# Patient Record
Sex: Female | Born: 1935 | Race: Black or African American | Hispanic: No | Smoking: Never smoker
Health system: Southern US, Community
[De-identification: ages and names within clinical notes are randomized; demographics above are authoritative.]

## PROBLEM LIST (undated history)

## (undated) DIAGNOSIS — E559 Vitamin D deficiency, unspecified: Secondary | ICD-10-CM

## (undated) DIAGNOSIS — Z1211 Encounter for screening for malignant neoplasm of colon: Secondary | ICD-10-CM

## (undated) DIAGNOSIS — I10 Essential (primary) hypertension: Secondary | ICD-10-CM

## (undated) DIAGNOSIS — E785 Hyperlipidemia, unspecified: Secondary | ICD-10-CM

## (undated) DIAGNOSIS — I452 Bifascicular block: Secondary | ICD-10-CM

## (undated) DIAGNOSIS — Z130 Encounter for screening for diseases of the blood and blood-forming organs and certain disorders involving the immune mechanism: Secondary | ICD-10-CM

## (undated) DIAGNOSIS — D649 Anemia, unspecified: Secondary | ICD-10-CM

## (undated) DIAGNOSIS — M1990 Unspecified osteoarthritis, unspecified site: Secondary | ICD-10-CM

## (undated) DIAGNOSIS — R229 Localized swelling, mass and lump, unspecified: Secondary | ICD-10-CM

## (undated) DIAGNOSIS — F419 Anxiety disorder, unspecified: Secondary | ICD-10-CM

## (undated) DIAGNOSIS — M1710 Unilateral primary osteoarthritis, unspecified knee: Secondary | ICD-10-CM

## (undated) DIAGNOSIS — M949 Disorder of cartilage, unspecified: Secondary | ICD-10-CM

## (undated) DIAGNOSIS — Z131 Encounter for screening for diabetes mellitus: Secondary | ICD-10-CM

## (undated) DIAGNOSIS — R918 Other nonspecific abnormal finding of lung field: Secondary | ICD-10-CM

## (undated) DIAGNOSIS — R7989 Other specified abnormal findings of blood chemistry: Secondary | ICD-10-CM

## (undated) DIAGNOSIS — Z01419 Encounter for gynecological examination (general) (routine) without abnormal findings: Secondary | ICD-10-CM

## (undated) DIAGNOSIS — IMO0002 Reserved for concepts with insufficient information to code with codable children: Secondary | ICD-10-CM

## (undated) DIAGNOSIS — M899 Disorder of bone, unspecified: Secondary | ICD-10-CM

## (undated) HISTORY — DX: Encounter for screening for diseases of the blood and blood-forming organs and certain disorders involving the immune mechanism: Z13.0

## (undated) HISTORY — DX: Localized swelling, mass and lump, unspecified: R22.9

## (undated) HISTORY — DX: Disorder of cartilage, unspecified: M94.9

## (undated) HISTORY — DX: Encounter for screening for malignant neoplasm of colon: Z12.11

## (undated) HISTORY — DX: Unilateral primary osteoarthritis, unspecified knee: M17.10

## (undated) HISTORY — DX: Bifascicular block: I45.2

## (undated) HISTORY — DX: Reserved for concepts with insufficient information to code with codable children: IMO0002

## (undated) HISTORY — DX: Encounter for screening for diabetes mellitus: Z13.1

## (undated) HISTORY — DX: Disorder of bone, unspecified: M89.9

## (undated) HISTORY — DX: Hyperlipidemia, unspecified: E78.5

## (undated) HISTORY — DX: Essential (primary) hypertension: I10

## (undated) HISTORY — DX: Vitamin D deficiency, unspecified: E55.9

## (undated) HISTORY — DX: Encounter for gynecological examination (general) (routine) without abnormal findings: Z01.419

## (undated) HISTORY — DX: Other specified abnormal findings of blood chemistry: R79.89

---
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
## 1973-06-10 HISTORY — PX: BACK SURGERY: SHX140

## 1991-06-11 HISTORY — PX: KNEE SURGERY: SHX244

## 2008-06-10 HISTORY — PX: OTHER SURGICAL HISTORY: SHX169

## 2011-07-23 ENCOUNTER — Other Ambulatory Visit: Payer: Self-pay | Admitting: Internal Medicine

## 2011-07-23 DIAGNOSIS — Z1231 Encounter for screening mammogram for malignant neoplasm of breast: Secondary | ICD-10-CM

## 2011-07-23 DIAGNOSIS — Z01419 Encounter for gynecological examination (general) (routine) without abnormal findings: Secondary | ICD-10-CM

## 2011-07-23 HISTORY — DX: Encounter for gynecological examination (general) (routine) without abnormal findings: Z01.419

## 2011-08-20 ENCOUNTER — Ambulatory Visit
Admission: RE | Admit: 2011-08-20 | Discharge: 2011-08-20 | Disposition: A | Discharge status: Home / Self Care | Payer: BC Managed Care – PPO | Source: Ambulatory Visit | Attending: Internal Medicine | Admitting: Internal Medicine

## 2011-08-20 ENCOUNTER — Other Ambulatory Visit: Payer: Self-pay | Admitting: Internal Medicine

## 2011-08-20 DIAGNOSIS — Z1231 Encounter for screening mammogram for malignant neoplasm of breast: Secondary | ICD-10-CM

## 2011-08-20 DIAGNOSIS — N63 Unspecified lump in unspecified breast: Secondary | ICD-10-CM

## 2011-08-30 ENCOUNTER — Ambulatory Visit
Admission: RE | Admit: 2011-08-30 | Discharge: 2011-08-30 | Disposition: A | Discharge status: Home / Self Care | Payer: Medicare Other | Source: Ambulatory Visit | Attending: Internal Medicine | Admitting: Internal Medicine

## 2011-08-30 DIAGNOSIS — N63 Unspecified lump in unspecified breast: Secondary | ICD-10-CM

## 2011-09-02 ENCOUNTER — Other Ambulatory Visit: Payer: Self-pay | Admitting: Internal Medicine

## 2011-09-02 DIAGNOSIS — N63 Unspecified lump in unspecified breast: Secondary | ICD-10-CM

## 2011-09-06 ENCOUNTER — Ambulatory Visit
Admission: RE | Admit: 2011-09-06 | Discharge: 2011-09-06 | Disposition: A | Discharge status: Home / Self Care | Payer: Medicare Other | Source: Ambulatory Visit | Attending: Internal Medicine | Admitting: Internal Medicine

## 2011-09-06 DIAGNOSIS — N63 Unspecified lump in unspecified breast: Secondary | ICD-10-CM

## 2011-09-06 MED ORDER — GADOBENATE DIMEGLUMINE 529 MG/ML IV SOLN
15.0000 mL | Freq: Once | INTRAVENOUS | Status: AC | PRN
  Administered 2011-09-06: 15 mL via INTRAVENOUS

## 2011-09-10 ENCOUNTER — Other Ambulatory Visit (HOSPITAL_COMMUNITY): Payer: Self-pay | Admitting: Internal Medicine

## 2011-09-10 DIAGNOSIS — R222 Localized swelling, mass and lump, trunk: Secondary | ICD-10-CM

## 2011-09-12 ENCOUNTER — Other Ambulatory Visit: Payer: Self-pay | Admitting: Internal Medicine

## 2011-09-12 ENCOUNTER — Telehealth (INDEPENDENT_AMBULATORY_CARE_PROVIDER_SITE_OTHER): Payer: Self-pay

## 2011-09-12 DIAGNOSIS — R222 Localized swelling, mass and lump, trunk: Secondary | ICD-10-CM

## 2011-09-12 NOTE — Telephone Encounter (Signed)
Tests reviewed by Dr Derrell Lolling. Per his request Kara Dies at Dr Volney Presser office notified that this pt will need to be referred to a Chest surgeon such as Dr Shari Heritage or Dr Nydia Bouton. She is advised this mass involves the lung and will not be a general surgery appt. She is advised we will cx appt here and she is to call pt to sched with chest surgeon.

## 2011-09-13 ENCOUNTER — Encounter (HOSPITAL_COMMUNITY)
Admission: RE | Admit: 2011-09-13 | Discharge: 2011-09-13 | Disposition: A | Discharge status: Home / Self Care | Payer: Medicare Other | Source: Ambulatory Visit | Attending: Internal Medicine | Admitting: Internal Medicine

## 2011-09-13 DIAGNOSIS — R222 Localized swelling, mass and lump, trunk: Secondary | ICD-10-CM | POA: Insufficient documentation

## 2011-09-13 DIAGNOSIS — M1990 Unspecified osteoarthritis, unspecified site: Secondary | ICD-10-CM | POA: Insufficient documentation

## 2011-09-13 MED ORDER — TECHNETIUM TC 99M MEDRONATE IV KIT
25.0000 | PACK | Freq: Once | INTRAVENOUS | Status: AC | PRN
  Administered 2011-09-13: 25 via INTRAVENOUS

## 2011-09-16 ENCOUNTER — Ambulatory Visit
Admission: RE | Admit: 2011-09-16 | Discharge: 2011-09-16 | Disposition: A | Discharge status: Home / Self Care | Payer: Medicare Other | Source: Ambulatory Visit | Attending: Internal Medicine | Admitting: Internal Medicine

## 2011-09-16 ENCOUNTER — Other Ambulatory Visit: Payer: Self-pay | Admitting: Internal Medicine

## 2011-09-16 DIAGNOSIS — R222 Localized swelling, mass and lump, trunk: Secondary | ICD-10-CM

## 2011-09-18 ENCOUNTER — Encounter: Payer: Self-pay | Admitting: Thoracic Surgery

## 2011-09-18 ENCOUNTER — Ambulatory Visit: Admission: RE | Admit: 2011-09-18 | Payer: Medicare Other | Source: Ambulatory Visit

## 2011-09-18 ENCOUNTER — Ambulatory Visit (INDEPENDENT_AMBULATORY_CARE_PROVIDER_SITE_OTHER): Payer: Medicare Other | Admitting: Thoracic Surgery

## 2011-09-18 VITALS — BP 186/86 | HR 64 | Resp 20 | Ht 67.0 in | Wt 174.0 lb

## 2011-09-18 DIAGNOSIS — M949 Disorder of cartilage, unspecified: Secondary | ICD-10-CM

## 2011-09-18 DIAGNOSIS — I10 Essential (primary) hypertension: Secondary | ICD-10-CM | POA: Insufficient documentation

## 2011-09-18 DIAGNOSIS — R222 Localized swelling, mass and lump, trunk: Secondary | ICD-10-CM

## 2011-09-18 DIAGNOSIS — E785 Hyperlipidemia, unspecified: Secondary | ICD-10-CM | POA: Insufficient documentation

## 2011-09-18 DIAGNOSIS — M899 Disorder of bone, unspecified: Secondary | ICD-10-CM | POA: Insufficient documentation

## 2011-09-18 NOTE — Progress Notes (Signed)
PCP is Oneal Grout, MD, MD Referring Provider is Oneal Grout, MD  Chief Complaint  Patient presents with  . Mediastinal Mass    Referral from Dr Glade Lloyd for eval on Left Chest wall mass    HPI: This 78 year old African American female was found to have a left chest wall mass. It was at the fourth intercostal space at the mid clavicular line and is nontender it is 3.4 x 2.87 cm in size and is dumbbell shaped. I explained that if this is probably a lipoma based on the CT findings. I recommend excision and she will have this done in approximately 4-6 weeks I explained to her that she would require a probable overnight stay.  Past Medical History  Diagnosis Date  . Hypertension   . Hyperlipemia   . Disorder of bone and cartilage, unspecified     Past Surgical History  Procedure Date  . Appendectomy 1953  . Back surgery 1975  . Knee surgery 1993  . Tumor in neck 2010    Dr Horton Finer Parthivel    Family History  Problem Relation Age of Onset  . Heart disease Mother   . Diabetes Sister   . Heart disease Son     Social History History  Substance Use Topics  . Smoking status: Never Smoker   . Smokeless tobacco: Never Used  . Alcohol Use: No    Current Outpatient Prescriptions  Medication Sig Dispense Refill  . aspirin 81 MG tablet Take 81 mg by mouth daily.      . hydrALAZINE (APRESOLINE) 25 MG tablet Take 25 mg by mouth 2 (two) times daily before a meal. Blood preasure      . hydrochlorothiazide (HYDRODIURIL) 25 MG tablet Take 25 mg by mouth daily. Blood preasure      . lisinopril (PRINIVIL,ZESTRIL) 20 MG tablet Take 20 mg by mouth daily. Blood preasure      . lovastatin (MEVACOR) 40 MG tablet Take 40 mg by mouth at bedtime. cholesterol      . metoprolol succinate (TOPROL-XL) 50 MG 24 hr tablet Take 50 mg by mouth daily. Blood preasure        No Known Allergies  Review of Systems  Constitutional: Negative.   HENT: Negative.   Eyes: Negative.   Respiratory:  Negative.   Cardiovascular: Negative.   Gastrointestinal: Negative.   Genitourinary: Negative.   Neurological: Negative.   Hematological: Negative.   Psychiatric/Behavioral: Negative.    there is a palpable 2-3 cm cyst on the chest wall mass that appears to be lipomatous in nature  BP 186/86  Pulse 64  Resp 20  Ht 5\' 7"  (1.702 m)  Wt 174 lb (78.926 kg)  BMI 27.25 kg/m2  SpO2 98% Physical Exam  Constitutional: She is oriented to person, place, and time. She appears well-developed and well-nourished.  HENT:  Head: Normocephalic and atraumatic.  Right Ear: External ear normal.  Left Ear: External ear normal.  Eyes: Conjunctivae and EOM are normal. Pupils are equal, round, and reactive to light.  Neck: Normal range of motion. Neck supple.  Cardiovascular: Normal rate and regular rhythm.   Pulmonary/Chest: Effort normal and breath sounds normal.  Abdominal: Soft. Bowel sounds are normal.  Musculoskeletal: Normal range of motion.  Neurological: She is alert and oriented to person, place, and time. She has normal reflexes.  Skin: Skin is warm and dry.  Psychiatric: She has a normal mood and affect. Her behavior is normal. Judgment and thought content normal.   3 cm  lipoma at the third intercostal space at the left midclavicular line   Diagnostic Tests: CT scan shows a bilobed lipoma left anterior chest wall   Impression: Lipoma left anterior chest wall   Plan: Excision of lipoma left anterior

## 2011-09-24 ENCOUNTER — Ambulatory Visit (INDEPENDENT_AMBULATORY_CARE_PROVIDER_SITE_OTHER): Payer: Medicare Other | Admitting: General Surgery

## 2011-10-09 DIAGNOSIS — R918 Other nonspecific abnormal finding of lung field: Secondary | ICD-10-CM

## 2011-10-09 HISTORY — DX: Other nonspecific abnormal finding of lung field: R91.8

## 2011-10-15 ENCOUNTER — Encounter: Payer: Self-pay | Admitting: Thoracic Surgery

## 2011-10-15 ENCOUNTER — Ambulatory Visit (INDEPENDENT_AMBULATORY_CARE_PROVIDER_SITE_OTHER): Payer: Medicare Other | Admitting: Thoracic Surgery

## 2011-10-15 ENCOUNTER — Other Ambulatory Visit: Payer: Self-pay

## 2011-10-15 VITALS — BP 188/84 | HR 61 | Resp 16 | Ht 67.0 in | Wt 174.0 lb

## 2011-10-15 DIAGNOSIS — R222 Localized swelling, mass and lump, trunk: Secondary | ICD-10-CM

## 2011-10-15 NOTE — Progress Notes (Signed)
HPI patient returns today with questions regarding her lipoma on the left anterior chest wall. She has what appears to be a lipoma between the third and fourth ribs anteriorly at the mid clavicular line. I have recommended excision. She had questions regarding the surgery. I told her that we could do this with a small incision but she may require a chest tube. If this is a liposarcoma, then I will have to do a wide excision with chest wall reconstruction. She understands the risk of the procedure and agrees to the surgery.  Current Outpatient Prescriptions  Medication Sig Dispense Refill  . aspirin 81 MG tablet Take 81 mg by mouth daily.      . hydrALAZINE (APRESOLINE) 25 MG tablet Take 25 mg by mouth 2 (two) times daily before a meal. Blood preasure      . hydrochlorothiazide (HYDRODIURIL) 25 MG tablet Take 25 mg by mouth daily. Blood preasure      . lisinopril (PRINIVIL,ZESTRIL) 20 MG tablet Take 20 mg by mouth daily. Blood preasure      . lovastatin (MEVACOR) 40 MG tablet Take 40 mg by mouth at bedtime. cholesterol      . metoprolol succinate (TOPROL-XL) 50 MG 24 hr tablet Take 50 mg by mouth daily. Blood preasure      . Vitamin D, Ergocalciferol, (DRISDOL) 50000 UNITS CAPS Take 50,000 Units by mouth.         Review of Systems: unchanged    Physical Exam lungs are clear to auscultation percussion probable lung mass left anterior chest wall    Diagnostic Tests: none   Impressi probable lipoma left anterior chest wallPlan: Excision on Tuesday, May14

## 2011-10-16 ENCOUNTER — Encounter (HOSPITAL_COMMUNITY): Payer: Self-pay | Admitting: Pharmacy Technician

## 2011-10-18 ENCOUNTER — Encounter (HOSPITAL_COMMUNITY): Payer: Self-pay

## 2011-10-18 ENCOUNTER — Encounter (HOSPITAL_COMMUNITY)
Admission: RE | Admit: 2011-10-18 | Discharge: 2011-10-18 | Disposition: A | Discharge status: Home / Self Care | Payer: Medicare Other | Source: Ambulatory Visit | Attending: Thoracic Surgery | Admitting: Thoracic Surgery

## 2011-10-18 VITALS — BP 157/74 | HR 64 | Temp 97.1°F | Resp 20 | Ht 67.0 in | Wt 170.8 lb

## 2011-10-18 DIAGNOSIS — R222 Localized swelling, mass and lump, trunk: Secondary | ICD-10-CM

## 2011-10-18 HISTORY — DX: Unspecified osteoarthritis, unspecified site: M19.90

## 2011-10-18 HISTORY — DX: Anxiety disorder, unspecified: F41.9

## 2011-10-18 HISTORY — DX: Anemia, unspecified: D64.9

## 2011-10-18 LAB — BLOOD GAS, ARTERIAL
Acid-Base Excess: 3.1 mmol/L — ABNORMAL HIGH (ref 0.0–2.0)
Bicarbonate: 27.1 meq/L — ABNORMAL HIGH (ref 20.0–24.0)
Drawn by: 181601
FIO2: 0.21 %
O2 Saturation: 98.3 %
Patient temperature: 98.6
TCO2: 28.4 mmol/L (ref 0–100)
pCO2 arterial: 41.8 mmHg (ref 35.0–45.0)
pH, Arterial: 7.428 — ABNORMAL HIGH (ref 7.350–7.400)
pO2, Arterial: 97.2 mmHg (ref 80.0–100.0)

## 2011-10-18 LAB — CBC
MCH: 23.4 pg — ABNORMAL LOW (ref 26.0–34.0)
MCHC: 31.5 g/dL (ref 30.0–36.0)
Platelets: 206 10*3/uL (ref 150–400)
RBC: 4.65 MIL/uL (ref 3.87–5.11)

## 2011-10-18 LAB — COMPREHENSIVE METABOLIC PANEL
ALT: 14 U/L (ref 0–35)
AST: 15 U/L (ref 0–37)
CO2: 24 mEq/L (ref 19–32)
Calcium: 10.2 mg/dL (ref 8.4–10.5)
Sodium: 139 mEq/L (ref 135–145)
Total Protein: 7.4 g/dL (ref 6.0–8.3)

## 2011-10-18 LAB — TYPE AND SCREEN
ABO/RH(D): B POS
Antibody Screen: NEGATIVE

## 2011-10-18 LAB — URINALYSIS, ROUTINE W REFLEX MICROSCOPIC
Bilirubin Urine: NEGATIVE
Glucose, UA: NEGATIVE mg/dL
Hgb urine dipstick: NEGATIVE
Ketones, ur: NEGATIVE mg/dL
Nitrite: NEGATIVE
Protein, ur: NEGATIVE mg/dL
Specific Gravity, Urine: 1.025 (ref 1.005–1.030)
Urobilinogen, UA: 0.2 mg/dL (ref 0.0–1.0)
pH: 6 (ref 5.0–8.0)

## 2011-10-18 LAB — SURGICAL PCR SCREEN: MRSA, PCR: NEGATIVE

## 2011-10-18 LAB — APTT: aPTT: 35 s (ref 24–37)

## 2011-10-18 LAB — PROTIME-INR
INR: 0.94 (ref 0.00–1.49)
Prothrombin Time: 12.8 s (ref 11.6–15.2)

## 2011-10-18 NOTE — Pre-Procedure Instructions (Signed)
20 Andrea Fox  10/18/2011   Your procedure is scheduled on:  10/22/11  Report to [facility] at 530 AM.  Call this number if you have problems the morning of surgery: [phone]   Remember:   Do not eat food:After Midnight.  May have clear liquids: up to 4 Hours before arrival.  Clear liquids include soda, tea, black coffee, apple or grape juice, broth.  Take these medicines the morning of surgery with A SIP OF WATER: apresoline,metoprolol   Do not wear jewelry, make-up or nail polish.  Do not wear lotions, powders, or perfumes. You may wear deodorant.  Do not shave 48 hours prior to surgery.  Do not bring valuables to the hospital.  Contacts, dentures or bridgework may not be worn into surgery.  Leave suitcase in the car. After surgery it may be brought to your room.  For patients admitted to the hospital, checkout time is 11:00 AM the day of discharge.   Patients discharged the day of surgery will not be allowed to drive home.  Name and phone number of your driver: family  Special Instructions: CHG Shower Use Special Wash: 1/2 bottle night before surgery and 1/2 bottle morning of surgery.   Please read over the following fact sheets that you were given: Pain Booklet, Coughing and Deep Breathing, Blood Transfusion Information, MRSA Information and Surgical Site Infection Prevention

## 2011-10-21 MED ORDER — DEXTROSE 5 % IV SOLN
1.5000 g | INTRAVENOUS | Status: DC
  Filled 2011-10-21: qty 1.5

## 2011-10-22 ENCOUNTER — Encounter (HOSPITAL_COMMUNITY): Payer: Self-pay | Admitting: *Deleted

## 2011-10-22 ENCOUNTER — Encounter (HOSPITAL_COMMUNITY): Payer: Self-pay | Admitting: Anesthesiology

## 2011-10-22 ENCOUNTER — Encounter (HOSPITAL_COMMUNITY)
Admission: RE | Disposition: A | Discharge status: Home / Self Care | Payer: Self-pay | Source: Ambulatory Visit | Attending: Thoracic Surgery

## 2011-10-22 ENCOUNTER — Ambulatory Visit (HOSPITAL_COMMUNITY): Payer: Medicare Other | Admitting: Anesthesiology

## 2011-10-22 ENCOUNTER — Ambulatory Visit (HOSPITAL_COMMUNITY)
Admission: RE | Admit: 2011-10-22 | Discharge: 2011-10-23 | Disposition: A | Discharge status: Home / Self Care | Payer: Medicare Other | Source: Ambulatory Visit | Attending: Thoracic Surgery | Admitting: Thoracic Surgery

## 2011-10-22 ENCOUNTER — Observation Stay (HOSPITAL_COMMUNITY): Payer: Medicare Other

## 2011-10-22 DIAGNOSIS — E785 Hyperlipidemia, unspecified: Secondary | ICD-10-CM | POA: Insufficient documentation

## 2011-10-22 DIAGNOSIS — M899 Disorder of bone, unspecified: Secondary | ICD-10-CM | POA: Insufficient documentation

## 2011-10-22 DIAGNOSIS — R222 Localized swelling, mass and lump, trunk: Secondary | ICD-10-CM

## 2011-10-22 DIAGNOSIS — F411 Generalized anxiety disorder: Secondary | ICD-10-CM | POA: Insufficient documentation

## 2011-10-22 DIAGNOSIS — D1779 Benign lipomatous neoplasm of other sites: Secondary | ICD-10-CM | POA: Insufficient documentation

## 2011-10-22 DIAGNOSIS — M949 Disorder of cartilage, unspecified: Secondary | ICD-10-CM | POA: Insufficient documentation

## 2011-10-22 DIAGNOSIS — D235 Other benign neoplasm of skin of trunk: Secondary | ICD-10-CM

## 2011-10-22 DIAGNOSIS — I10 Essential (primary) hypertension: Secondary | ICD-10-CM | POA: Insufficient documentation

## 2011-10-22 HISTORY — PX: OTHER SURGICAL HISTORY: SHX169

## 2011-10-22 HISTORY — PX: MASS EXCISION: SHX2000

## 2011-10-22 HISTORY — DX: Other nonspecific abnormal finding of lung field: R91.8

## 2011-10-22 SURGERY — EXCISION MASS
Anesthesia: General | Site: Chest | Laterality: Left | Wound class: Clean

## 2011-10-22 MED ORDER — GLYCOPYRROLATE 0.2 MG/ML IJ SOLN
INTRAMUSCULAR | Status: DC | PRN
  Administered 2011-10-22: 0.6 mg via INTRAVENOUS

## 2011-10-22 MED ORDER — ONDANSETRON HCL 4 MG/2ML IJ SOLN: 4.0000 mg | Freq: Four times a day (QID) | INTRAMUSCULAR | Status: DC | PRN

## 2011-10-22 MED ORDER — 0.9 % SODIUM CHLORIDE (POUR BTL) OPTIME
TOPICAL | Status: DC | PRN
  Administered 2011-10-22: 1000 mL

## 2011-10-22 MED ORDER — MORPHINE SULFATE 4 MG/ML IJ SOLN: 0.0500 mg/kg | INTRAMUSCULAR | Status: DC | PRN

## 2011-10-22 MED ORDER — EPHEDRINE SULFATE 50 MG/ML IJ SOLN
INTRAMUSCULAR | Status: DC | PRN
  Administered 2011-10-22: 10 mg via INTRAVENOUS

## 2011-10-22 MED ORDER — DEXTROSE 5 % IV SOLN
1.5000 g | INTRAVENOUS | Status: DC | PRN
  Administered 2011-10-22: 1.5 g via INTRAVENOUS

## 2011-10-22 MED ORDER — ONDANSETRON HCL 4 MG/2ML IJ SOLN
INTRAMUSCULAR | Status: DC | PRN
  Administered 2011-10-22: 4 mg via INTRAVENOUS

## 2011-10-22 MED ORDER — ASPIRIN 81 MG PO CHEW
81.0000 mg | CHEWABLE_TABLET | Freq: Every day | ORAL | Status: DC
  Administered 2011-10-22: 81 mg via ORAL
  Filled 2011-10-22 (×2): qty 1

## 2011-10-22 MED ORDER — HYDRALAZINE HCL 25 MG PO TABS
25.0000 mg | ORAL_TABLET | Freq: Two times a day (BID) | ORAL | Status: DC
  Administered 2011-10-23: 25 mg via ORAL
  Filled 2011-10-22 (×4): qty 1

## 2011-10-22 MED ORDER — KCL IN DEXTROSE-NACL 20-5-0.45 MEQ/L-%-% IV SOLN
INTRAVENOUS | Status: DC
  Administered 2011-10-22: 10:00:00 via INTRAVENOUS
  Filled 2011-10-22 (×2): qty 1000

## 2011-10-22 MED ORDER — ONDANSETRON HCL 4 MG/2ML IJ SOLN: 4.0000 mg | Freq: Once | INTRAMUSCULAR | Status: DC | PRN

## 2011-10-22 MED ORDER — LIDOCAINE HCL (PF) 2 % IJ SOLN
INTRAMUSCULAR | Status: DC | PRN
  Administered 2011-10-22: 100 mg

## 2011-10-22 MED ORDER — VITAMIN D (ERGOCALCIFEROL) 1.25 MG (50000 UNIT) PO CAPS: 50000.0000 [IU] | ORAL_CAPSULE | ORAL | Status: DC

## 2011-10-22 MED ORDER — METOPROLOL SUCCINATE ER 50 MG PO TB24
50.0000 mg | ORAL_TABLET | Freq: Every day | ORAL | Status: DC
  Filled 2011-10-22: qty 1

## 2011-10-22 MED ORDER — ROCURONIUM BROMIDE 100 MG/10ML IV SOLN
INTRAVENOUS | Status: DC | PRN
  Administered 2011-10-22: 50 mg via INTRAVENOUS

## 2011-10-22 MED ORDER — HYDROMORPHONE HCL PF 1 MG/ML IJ SOLN: 0.2500 mg | INTRAMUSCULAR | Status: DC | PRN

## 2011-10-22 MED ORDER — FENTANYL CITRATE 0.05 MG/ML IJ SOLN
INTRAMUSCULAR | Status: DC | PRN
  Administered 2011-10-22: 100 ug via INTRAVENOUS
  Administered 2011-10-22: 50 ug via INTRAVENOUS

## 2011-10-22 MED ORDER — HYDROCHLOROTHIAZIDE 25 MG PO TABS
25.0000 mg | ORAL_TABLET | Freq: Every day | ORAL | Status: DC
  Administered 2011-10-22: 25 mg via ORAL
  Filled 2011-10-22 (×2): qty 1

## 2011-10-22 MED ORDER — NEOSTIGMINE METHYLSULFATE 1 MG/ML IJ SOLN
INTRAMUSCULAR | Status: DC | PRN
  Administered 2011-10-22: 4 mg via INTRAVENOUS

## 2011-10-22 MED ORDER — ACETAMINOPHEN 650 MG RE SUPP: 650.0000 mg | RECTAL | Status: DC | PRN

## 2011-10-22 MED ORDER — PROPOFOL 10 MG/ML IV EMUL
INTRAVENOUS | Status: DC | PRN
  Administered 2011-10-22: 110 mg via INTRAVENOUS

## 2011-10-22 MED ORDER — SIMVASTATIN 20 MG PO TABS
20.0000 mg | ORAL_TABLET | Freq: Every day | ORAL | Status: DC
  Filled 2011-10-22 (×2): qty 1

## 2011-10-22 MED ORDER — LISINOPRIL 40 MG PO TABS
40.0000 mg | ORAL_TABLET | Freq: Every day | ORAL | Status: DC
Start: 2011-10-22 — End: 2011-10-23
  Administered 2011-10-22: 40 mg via ORAL
  Filled 2011-10-22 (×2): qty 1

## 2011-10-22 MED ORDER — FENTANYL CITRATE 0.05 MG/ML IJ SOLN
25.0000 ug | INTRAMUSCULAR | Status: DC | PRN
  Administered 2011-10-22 (×2): 25 ug via INTRAVENOUS
  Filled 2011-10-22 (×2): qty 2

## 2011-10-22 MED ORDER — LACTATED RINGERS IV SOLN
INTRAVENOUS | Status: DC | PRN
  Administered 2011-10-22: 07:00:00 via INTRAVENOUS

## 2011-10-22 MED ORDER — ASPIRIN 81 MG PO TABS: 81.0000 mg | ORAL_TABLET | Freq: Every day | ORAL | Status: DC

## 2011-10-22 MED ORDER — ACETAMINOPHEN 325 MG PO TABS
650.0000 mg | ORAL_TABLET | ORAL | Status: DC | PRN
  Administered 2011-10-23: 650 mg via ORAL
  Filled 2011-10-22: qty 2

## 2011-10-22 MED ORDER — OXYCODONE HCL 5 MG PO TABS
5.0000 mg | ORAL_TABLET | ORAL | Status: DC | PRN
  Administered 2011-10-22: 5 mg via ORAL
  Filled 2011-10-22: qty 2

## 2011-10-22 MED ORDER — MIDAZOLAM HCL 5 MG/5ML IJ SOLN
INTRAMUSCULAR | Status: DC | PRN
  Administered 2011-10-22: 2 mg via INTRAVENOUS

## 2011-10-22 SURGICAL SUPPLY — 37 items
CANISTER SUCTION 2500CC (MISCELLANEOUS) ×2 IMPLANT
CLIP TI MEDIUM 6 (CLIP) ×2 IMPLANT
CLOTH BEACON ORANGE TIMEOUT ST (SAFETY) ×2 IMPLANT
CONT SPEC 4OZ CLIKSEAL STRL BL (MISCELLANEOUS) ×2 IMPLANT
COVER SURGICAL LIGHT HANDLE (MISCELLANEOUS) ×4 IMPLANT
DERMABOND ADVANCED (GAUZE/BANDAGES/DRESSINGS) ×1
DERMABOND ADVANCED .7 DNX12 (GAUZE/BANDAGES/DRESSINGS) ×1 IMPLANT
DRAPE CHEST BREAST 15X10 FENES (DRAPES) ×2 IMPLANT
ELECT REM PT RETURN 9FT ADLT (ELECTROSURGICAL) ×2
ELECTRODE REM PT RTRN 9FT ADLT (ELECTROSURGICAL) ×1 IMPLANT
GLOVE BIO SURGEON STRL SZ 6 (GLOVE) ×4 IMPLANT
GLOVE BIOGEL PI IND STRL 6.5 (GLOVE) ×2 IMPLANT
GLOVE BIOGEL PI INDICATOR 6.5 (GLOVE) ×2
GLOVE SURG SIGNA 7.5 PF LTX (GLOVE) ×2 IMPLANT
GOWN BRE IMP PREV XXLGXLNG (GOWN DISPOSABLE) ×2 IMPLANT
GOWN PREVENTION PLUS XLARGE (GOWN DISPOSABLE) ×2 IMPLANT
GOWN STRL NON-REIN LRG LVL3 (GOWN DISPOSABLE) ×2 IMPLANT
HEMOSTAT SURGICEL 2X14 (HEMOSTASIS) IMPLANT
KIT BASIN OR (CUSTOM PROCEDURE TRAY) ×2 IMPLANT
KIT ROOM TURNOVER OR (KITS) ×2 IMPLANT
NEEDLE 22X1 1/2 (OR ONLY) (NEEDLE) IMPLANT
NS IRRIG 1000ML POUR BTL (IV SOLUTION) ×2 IMPLANT
PACK GENERAL/GYN (CUSTOM PROCEDURE TRAY) ×2 IMPLANT
PAD ARMBOARD 7.5X6 YLW CONV (MISCELLANEOUS) ×4 IMPLANT
SPONGE GAUZE 4X4 12PLY (GAUZE/BANDAGES/DRESSINGS) ×2 IMPLANT
SPONGE INTESTINAL PEANUT (DISPOSABLE) ×2 IMPLANT
SUT SILK 2 0 TIES 10X30 (SUTURE) ×2 IMPLANT
SUT VIC AB 2-0 CT1 27 (SUTURE) ×3
SUT VIC AB 2-0 CT1 TAPERPNT 27 (SUTURE) ×3 IMPLANT
SUT VIC AB 3-0 SH 27 (SUTURE) ×1
SUT VIC AB 3-0 SH 27X BRD (SUTURE) ×1 IMPLANT
SUT VICRYL 4-0 PS2 18IN ABS (SUTURE) ×2 IMPLANT
SYR CONTROL 10ML LL (SYRINGE) IMPLANT
TAPE CLOTH SURG 4X10 WHT LF (GAUZE/BANDAGES/DRESSINGS) ×2 IMPLANT
TOWEL OR 17X24 6PK STRL BLUE (TOWEL DISPOSABLE) ×2 IMPLANT
TOWEL OR 17X26 10 PK STRL BLUE (TOWEL DISPOSABLE) ×2 IMPLANT
WATER STERILE IRR 1000ML POUR (IV SOLUTION) ×2 IMPLANT

## 2011-10-22 NOTE — Preoperative (Signed)
Beta Blockers   Reason not to administer Beta Blockers:Not Applicable 

## 2011-10-22 NOTE — H&P (View-Only) (Signed)
HPI patient returns today with questions regarding her lipoma on the left anterior chest wall. She has what appears to be a lipoma between the third and fourth ribs anteriorly at the mid clavicular line. I have recommended excision. She had questions regarding the surgery. I told her that we could do this with a small incision but she may require a chest tube. If this is a liposarcoma, then I will have to do a wide excision with chest wall reconstruction. She understands the risk of the procedure and agrees to the surgery.  Current Outpatient Prescriptions  Medication Sig Dispense Refill  . aspirin 81 MG tablet Take 81 mg by mouth daily.      . hydrALAZINE (APRESOLINE) 25 MG tablet Take 25 mg by mouth 2 (two) times daily before a meal. Blood preasure      . hydrochlorothiazide (HYDRODIURIL) 25 MG tablet Take 25 mg by mouth daily. Blood preasure      . lisinopril (PRINIVIL,ZESTRIL) 20 MG tablet Take 20 mg by mouth daily. Blood preasure      . lovastatin (MEVACOR) 40 MG tablet Take 40 mg by mouth at bedtime. cholesterol      . metoprolol succinate (TOPROL-XL) 50 MG 24 hr tablet Take 50 mg by mouth daily. Blood preasure      . Vitamin D, Ergocalciferol, (DRISDOL) 50000 UNITS CAPS Take 50,000 Units by mouth.         Review of Systems: unchanged    Physical Exam lungs are clear to auscultation percussion probable lung mass left anterior chest wall    Diagnostic Tests: none   Impressi probable lipoma left anterior chest wallPlan: Excision on Tuesday, May14     

## 2011-10-22 NOTE — Op Note (Signed)
NAMELATAUSHA, FLAMM NO.:  [id number]  MEDICAL RECORD NO.:  [id number]  LOCATION:  2028                         FACILITY:  [facility]  PHYSICIAN:  Ines Bloomer, M.D. DATE OF BIRTH:  06/27/1931  DATE OF PROCEDURE: DATE OF DISCHARGE:                              OPERATIVE REPORT   PREOPERATIVE DIAGNOSIS:  Left anterior chest wall mass, probable lipoma.  POSTOPERATIVE DIAGNOSIS:  Left anterior chest wall mass, probable lipoma at the 3rd interspace.  SURGEON:  Ines Bloomer, M.D.  FIRST ASSISTANCE:  Coral Ceo, P.A.  ANESTHESIA:  General anesthesia.  After prepping and draping around the left chest, an incision was made over the mass and dissection was carried down through the subcutaneous tissues, this was about 4 cm to 5 cm incision.  The pectoralis major muscle was split and pectoralis minor muscle was also split, and this allowed [place] to get down to the second intercostal space between the 2nd and the 3rd ribs.  There was a large lipoma below the muscle layer, and this was dissected up with sharp dissection.  We then dissected out of the inner space with sharp dissection using electrocautery for bleeding. It was then removed from the interspace in total.  No residual mass was seen.  We sent for permanent section, and it appeared to be a benign lipoma.  The muscle was closed with interrupted 2-0 Vicryl subcutaneous tissue with 3-0 Vicryl, and Dermabond for the skin.  The patient was turned to recovery room in stable condition.     Ines Bloomer, M.D.     DPB/MEDQ  D:  10/22/2011  T:  10/22/2011  Job:  [id number]

## 2011-10-22 NOTE — Interval H&P Note (Signed)
History and Physical Interval Note:  10/22/2011 7:06 AM  Andrea Fox  has presented today for surgery, with the diagnosis of (L) CHEST MASS  The various methods of treatment have been discussed with the patient and family. After consideration of risks, benefits and other options for treatment, the patient has consented to  Procedure(s) (LRB): EXCISION MASS (Left) as a surgical intervention .  The patients' history has been reviewed, patient examined, no change in status, stable for surgery.  I have reviewed the patients' chart and labs.  Questions were answered to the patient's satisfaction.     Cameron Proud

## 2011-10-22 NOTE — Anesthesia Postprocedure Evaluation (Signed)
Anesthesia Post Note  Patient: Andrea Fox  Procedure(s) Performed: Procedure(s) (LRB): EXCISION MASS (Left)  Anesthesia type: general  Patient location: PACU  Post pain: Pain level controlled  Post assessment: Patient's Cardiovascular Status Stable  Last Vitals:  Filed Vitals:   10/22/11 0907  BP: 201/71  Pulse: 75  Temp:   Resp: 20    Post vital signs: Reviewed and stable  Level of consciousness: sedated  Complications: No apparent anesthesia complications

## 2011-10-22 NOTE — Anesthesia Preprocedure Evaluation (Addendum)
Anesthesia Evaluation  Patient identified by MRN, date of birth, ID band Patient awake    Reviewed: Allergy & Precautions, H&P , NPO status , Patient's Chart, lab work & pertinent test results, reviewed documented beta blocker date and time   History of Anesthesia Complications (+) AWARENESS UNDER ANESTHESIA  Airway Mallampati: II TM Distance: >3 FB Neck ROM: Full    Dental  (+) Missing, Partial Lower, Partial Upper and Dental Advisory Given   Pulmonary neg pulmonary ROS,    Pulmonary exam normal       Cardiovascular Exercise Tolerance: Good hypertension, Rhythm:Regular     Neuro/Psych Anxiety negative neurological ROS     GI/Hepatic negative GI ROS, Neg liver ROS,   Endo/Other  negative endocrine ROS  Renal/GU negative Renal ROS  negative genitourinary   Musculoskeletal negative musculoskeletal ROS (+)   Abdominal   Peds negative pediatric ROS (+)  Hematology   Anesthesia Other Findings   Reproductive/Obstetrics negative OB ROS                         Anesthesia Physical Anesthesia Plan  ASA: III  Anesthesia Plan: General   Post-op Pain Management:    Induction: Intravenous  Airway Management Planned: Oral ETT  Additional Equipment:   Intra-op Plan:   Post-operative Plan: Extubation in OR  Informed Consent:   Dental advisory given  Plan Discussed with: CRNA and Surgeon  Anesthesia Plan Comments:        Anesthesia Quick Evaluation

## 2011-10-22 NOTE — Brief Op Note (Signed)
10/22/2011  8:39 AM  PATIENT:  Andrea Fox  78 y.o. female  PRE-OPERATIVE DIAGNOSIS:  Left Chest Mass  POST-OPERATIVE DIAGNOSIS:  Left Chest Mass  PROCEDURE:  Procedure(s) (LRB): EXCISION MASS (Left)  SURGEON:  Surgeon(s) and Role:    * Ines Bloomer, MD - Primary  PHYSICIAN ASSISTANT:   ASSISTANTS: Coral Ceo PAC ANESTHESIA:   general  EBL:  Total I/O In: 700 [I.V.:700] Out: 10 [Blood:10]  BLOOD ADMINISTERED:none  DRAINS: none   LOCAL MEDICATIONS USED:  NONE  SPECIMEN:  Excision  DISPOSITION OF SPECIMEN:  PATHOLOGY  COUNTS:  YES  TOURNIQUET:  * No tourniquets in log *  DICTATION: .Other Dictation: Dictation Number [id number]  PLAN OF CARE: Admit to inpatient   PATIENT DISPOSITION:  PACU - hemodynamically stable.   Delay start of Pharmacological VTE agent (>24hrs) due to surgical blood loss or risk of bleeding: yes

## 2011-10-22 NOTE — Transfer of Care (Signed)
Immediate Anesthesia Transfer of Care Note  Patient: Andrea Fox  Procedure(s) Performed: Procedure(s) (LRB): EXCISION MASS (Left)  Patient Location: PACU  Anesthesia Type: General  Level of Consciousness: awake, alert , oriented and sedated  Airway & Oxygen Therapy: Patient Spontanous Breathing and Patient connected to nasal cannula oxygen  Post-op Assessment: Report given to PACU RN, Post -op Vital signs reviewed and stable and Patient moving all extremities  Post vital signs: Reviewed and stable  Complications: No apparent anesthesia complications

## 2011-10-22 NOTE — Anesthesia Procedure Notes (Signed)
Procedure Name: Intubation Date/Time: 10/22/2011 7:45 AM Performed by: Fransisca Kaufmann Pre-anesthesia Checklist: Patient identified, Emergency Drugs available, Suction available, Patient being monitored and Timeout performed Patient Re-evaluated:Patient Re-evaluated prior to inductionOxygen Delivery Method: Circle system utilized Preoxygenation: Pre-oxygenation with 100% oxygen Intubation Type: IV induction Ventilation: Mask ventilation without difficulty and Oral airway inserted - appropriate to patient size Laryngoscope Size: Hyacinth Meeker and 2 Grade View: Grade II Tube type: Oral Tube size: 7.0 mm Number of attempts: 1 Airway Equipment and Method: Stylet and Oral airway Placement Confirmation: ETT inserted through vocal cords under direct vision,  positive ETCO2 and breath sounds checked- equal and bilateral Secured at: 22 cm Tube secured with: Tape Dental Injury: Teeth and Oropharynx as per pre-operative assessment

## 2011-10-23 ENCOUNTER — Ambulatory Visit (HOSPITAL_COMMUNITY): Payer: Medicare Other

## 2011-10-23 ENCOUNTER — Encounter (HOSPITAL_COMMUNITY): Payer: Self-pay | Admitting: Thoracic Surgery

## 2011-10-23 ENCOUNTER — Ambulatory Visit (INDEPENDENT_AMBULATORY_CARE_PROVIDER_SITE_OTHER): Payer: Medicare Other | Admitting: General Surgery

## 2011-10-23 MED ORDER — OXYCODONE HCL 5 MG PO TABS: 5.0000 mg | ORAL_TABLET | ORAL | Status: AC | PRN

## 2011-10-23 NOTE — Progress Notes (Signed)
1120 - pt d/c home with instructions, f/u appointment, and prescription for pain med.  Pt verbalized understanding of instructions, home with family, escorted self out per request.  Ninetta Lights RN

## 2011-10-23 NOTE — Discharge Instructions (Signed)
You may shower.  You may resume your regular diet and activity.

## 2011-10-23 NOTE — Progress Notes (Signed)
                                              1 Day Post-Op Procedure(s) (LRB): EXCISION MASS (Left) Subjective: Stable. Doing well. Wound OK. Plan DC today. Follow up in office. Lab stable.  Objective: Vital signs in last 24 hours: Temp:  [96.4 F (35.8 C)-97.9 F (36.6 C)] 97.5 F (36.4 C) (05/15 0430) Pulse Rate:  [56-88] 56  (05/15 0430) Cardiac Rhythm:  [-] Normal sinus rhythm (05/14 2100) Resp:  [15-21] 18  (05/15 0430) BP: (117-213)/(54-86) 133/61 mmHg (05/15 0430) SpO2:  [95 %-100 %] 100 % (05/15 0430)  Hemodynamic parameters for last 24 hours:    Intake/Output from previous day: 05/14 0701 - 05/15 0700 In: 920 [P.O.:120; I.V.:800] Out: 10 [Blood:10] Intake/Output this shift:    General appearance: alert Heart: regular rate and rhythm, S1, S2 normal, no murmur, click, rub or gallop Lungs: clear to auscultation bilaterally  Lab Results: No results found for this basename: WBC:2,HGB:2,HCT:2,PLT:2 in the last 72 hours BMET: No results found for this basename: NA:2,K:2,CL:2,CO2:2,GLUCOSE:2,BUN:2,CREATININE:2,CALCIUM:2 in the last 72 hours  PT/INR: No results found for this basename: LABPROT,INR in the last 72 hours ABG    Component Value Date/Time   PHART 7.428* 10/18/2011 1041   HCO3 27.1* 10/18/2011 1041   TCO2 28.4 10/18/2011 1041   O2SAT 98.3 10/18/2011 1041   CBG (last 3)  No results found for this basename: GLUCAP:3 in the last 72 hours  Assessment/Plan: S/P Procedure(s) (LRB): EXCISION MASS (Left) Plan for discharge: see discharge orders   LOS: 1 day    Breena Bevacqua PATRICK 10/23/2011

## 2011-10-23 NOTE — Discharge Summary (Signed)
[address]            [place] [address]          [phone]         Discharge Summary  Name: Andrea Fox DOB: [birth year] 78 y.o. MRN: [id number]  Admission Date: 10/22/2011 Discharge Date:    Admitting Diagnosis: Left chest wall mass  Discharge Diagnosis:  Lipoma, left chest wall Hypertension  Hyperlipidemia Disorder of bone and cartilage, unspecified   Procedures: RESECTION OF LEFT CHEST WALL MASS- 10/22/2011   HPI:  The patient is a 78 y.o. female who was referred to Dr. Edwyna Shell for evaluation of a left chest wall mass.  He felt the area represented a lipoma, which would require surgical resection.  All risks, benefits and alternatives of surgery were explained in detail, and the patient agreed to proceed.   Hospital Course:  The patient was admitted to [facility] on 5/14/2013The patient was taken to the operating room and underwent the above procedure.    The patient was kept in the hospital for overnight observation.  She remained stable, and was able to be discharged home on 10/23/2011.  Final path is pending.   Recent vital signs:  Filed Vitals:   10/23/11 0430  BP: 133/61  Pulse: 56  Temp: 97.5 F (36.4 C)  Resp: 18    Recent laboratory studies:  CBC:No results found for this basename: WBC:2,HGB:2,HCT:2,PLT:2 in the last 72 hours BMET: No results found for this basename: NA:2,K:2,CL:2,CO2:2,GLUCOSE:2,BUN:2,CREATININE:2,CALCIUM:2 in the last 72 hours  PT/INR: No results found for this basename: LABPROT,INR in the last 72 hours  Discharge Medications:   Medication List  As of 10/23/2011  7:50 AM   TAKE these medications         aspirin 81 MG tablet   Take 81 mg by mouth daily.      hydrALAZINE 25 MG tablet   Commonly known as: APRESOLINE   Take 25 mg by mouth 2 (two) times daily before a meal. Blood preasure      hydrochlorothiazide 25 MG tablet   Commonly known as: HYDRODIURIL   Take 25 mg by mouth daily.  Blood preasure      lisinopril 40 MG tablet   Commonly known as: PRINIVIL,ZESTRIL   Take 40 mg by mouth daily.      lovastatin 40 MG tablet   Commonly known as: MEVACOR   Take 40 mg by mouth at bedtime. cholesterol      metoprolol succinate 50 MG 24 hr tablet   Commonly known as: TOPROL-XL   Take 50 mg by mouth daily. Blood preasure      oxyCODONE 5 MG immediate release tablet   Commonly known as: Oxy IR/ROXICODONE   Take 1-2 tablets (5-10 mg total) by mouth every 4 (four) hours as needed for pain.      Vitamin D (Ergocalciferol) 50000 UNITS Caps   Commonly known as: DRISDOL   Take 50,000 Units by mouth every 7 (seven) days.            Discharge Instructions:  May shower daily and clean incisions with soap and water.  May resume regular diet and activity as tolerated.    Follow-up Information    Follow up with Cameron Proud, MD in 1 week. (Office will call to schedule an appointment)    Contact information:   [address]  [place] [address] [phone]           Martha Ellerby H 10/23/2011, 7:50 AM

## 2011-10-25 ENCOUNTER — Other Ambulatory Visit: Payer: Self-pay | Admitting: Thoracic Surgery

## 2011-10-25 DIAGNOSIS — R222 Localized swelling, mass and lump, trunk: Secondary | ICD-10-CM

## 2011-10-30 ENCOUNTER — Encounter: Payer: Self-pay | Admitting: Thoracic Surgery

## 2011-10-30 ENCOUNTER — Ambulatory Visit
Admission: RE | Admit: 2011-10-30 | Discharge: 2011-10-30 | Disposition: A | Discharge status: Home / Self Care | Payer: Medicare Other | Source: Ambulatory Visit | Attending: Thoracic Surgery | Admitting: Thoracic Surgery

## 2011-10-30 ENCOUNTER — Ambulatory Visit (INDEPENDENT_AMBULATORY_CARE_PROVIDER_SITE_OTHER): Payer: Medicare Other | Admitting: Thoracic Surgery

## 2011-10-30 DIAGNOSIS — Z09 Encounter for follow-up examination after completed treatment for conditions other than malignant neoplasm: Secondary | ICD-10-CM

## 2011-10-30 DIAGNOSIS — D179 Benign lipomatous neoplasm, unspecified: Secondary | ICD-10-CM

## 2011-10-30 DIAGNOSIS — R222 Localized swelling, mass and lump, trunk: Secondary | ICD-10-CM

## 2011-10-30 NOTE — Progress Notes (Signed)
HPI patient returns for followup today. Her incision is well-healed she has 6.8 cm lipoma below her medical muscles that was between the third and fourth ribs she is doing well overall with just some mild tenderness. I discussed the operative findings with her and her family. We'll see her back again in 3 weeks   Current Outpatient Prescriptions  Medication Sig Dispense Refill  . aspirin 81 MG tablet Take 81 mg by mouth daily.      . hydrALAZINE (APRESOLINE) 25 MG tablet Take 25 mg by mouth 2 (two) times daily before a meal. Blood preasure      . hydrochlorothiazide (HYDRODIURIL) 25 MG tablet Take 25 mg by mouth daily. Blood preasure      . lisinopril (PRINIVIL,ZESTRIL) 40 MG tablet Take 40 mg by mouth daily.      Marland Kitchen lovastatin (MEVACOR) 40 MG tablet Take 40 mg by mouth at bedtime. cholesterol      . metoprolol succinate (TOPROL-XL) 50 MG 24 hr tablet Take 50 mg by mouth daily. Blood preasure      . oxyCODONE (OXY IR/ROXICODONE) 5 MG immediate release tablet Take 1-2 tablets (5-10 mg total) by mouth every 4 (four) hours as needed for pain.  30 tablet  0  . Vitamin D, Ergocalciferol, (DRISDOL) 50000 UNITS CAPS Take 50,000 Units by mouth every 7 (seven) days.          Review of Systems: Unchanged   Physical Exam incision well healed lungs are clear attestation percussion   Diagnostic Tests: Chest x-ray shows no active disease   Impression: Left chest wall lipoma resected   Plan: Return in 3 weeks

## 2011-11-20 ENCOUNTER — Ambulatory Visit (INDEPENDENT_AMBULATORY_CARE_PROVIDER_SITE_OTHER): Payer: Medicare Other | Admitting: Thoracic Surgery

## 2011-11-20 ENCOUNTER — Encounter: Payer: Self-pay | Admitting: Thoracic Surgery

## 2011-11-20 VITALS — BP 144/76 | HR 64 | Resp 20 | Ht 67.0 in | Wt 170.0 lb

## 2011-11-20 DIAGNOSIS — Z09 Encounter for follow-up examination after completed treatment for conditions other than malignant neoplasm: Secondary | ICD-10-CM

## 2011-11-20 DIAGNOSIS — D179 Benign lipomatous neoplasm, unspecified: Secondary | ICD-10-CM

## 2011-11-20 NOTE — Progress Notes (Signed)
HPI patient returns for followup after excision of the chest wall lipoma of the extended into the left chest. Incision is well healed there is no evidence of any recurrence. She is doing well overall. We will refer P. followup by her primary care physician   Current Outpatient Prescriptions  Medication Sig Dispense Refill  . aspirin 81 MG tablet Take 81 mg by mouth daily.      . hydrALAZINE (APRESOLINE) 25 MG tablet Take 25 mg by mouth 2 (two) times daily before a meal. Blood preasure      . hydrochlorothiazide (HYDRODIURIL) 25 MG tablet Take 25 mg by mouth daily. Blood preasure      . lisinopril (PRINIVIL,ZESTRIL) 40 MG tablet Take 40 mg by mouth daily.      Marland Kitchen lovastatin (MEVACOR) 40 MG tablet Take 40 mg by mouth at bedtime. cholesterol      . metoprolol succinate (TOPROL-XL) 50 MG 24 hr tablet Take 50 mg by mouth daily. Blood preasure      . Vitamin D, Ergocalciferol, (DRISDOL) 50000 UNITS CAPS Take 50,000 Units by mouth every 7 (seven) days.          Review of Systems: Unchanged   Physical Exam lungs are clear auscultation percussion   Diagnostic Tests: None   Chest wall lipoma status post excision   Plan: Return as needed

## 2012-07-29 ENCOUNTER — Other Ambulatory Visit: Payer: Self-pay | Admitting: Internal Medicine

## 2012-07-29 DIAGNOSIS — Z1231 Encounter for screening mammogram for malignant neoplasm of breast: Secondary | ICD-10-CM

## 2012-07-30 ENCOUNTER — Ambulatory Visit: Payer: 59

## 2012-08-31 ENCOUNTER — Ambulatory Visit
Admission: RE | Admit: 2012-08-31 | Discharge: 2012-08-31 | Disposition: A | Discharge status: Home / Self Care | Payer: Medicare Other | Source: Ambulatory Visit | Attending: Internal Medicine | Admitting: Internal Medicine

## 2012-08-31 DIAGNOSIS — Z1231 Encounter for screening mammogram for malignant neoplasm of breast: Secondary | ICD-10-CM

## 2012-09-15 ENCOUNTER — Encounter: Payer: Self-pay | Admitting: Internal Medicine

## 2012-10-21 ENCOUNTER — Ambulatory Visit: Payer: Self-pay | Admitting: Internal Medicine

## 2012-11-03 ENCOUNTER — Encounter: Payer: Self-pay | Admitting: *Deleted

## 2012-11-04 ENCOUNTER — Encounter: Payer: Self-pay | Admitting: Internal Medicine

## 2012-11-04 ENCOUNTER — Ambulatory Visit (INDEPENDENT_AMBULATORY_CARE_PROVIDER_SITE_OTHER): Payer: Medicare Other | Admitting: Internal Medicine

## 2012-11-04 VITALS — BP 144/82 | HR 61 | Temp 97.7°F | Resp 14 | Ht 67.0 in | Wt 174.2 lb

## 2012-11-04 DIAGNOSIS — I872 Venous insufficiency (chronic) (peripheral): Secondary | ICD-10-CM

## 2012-11-04 DIAGNOSIS — M949 Disorder of cartilage, unspecified: Secondary | ICD-10-CM

## 2012-11-04 DIAGNOSIS — I878 Other specified disorders of veins: Secondary | ICD-10-CM

## 2012-11-04 DIAGNOSIS — I10 Essential (primary) hypertension: Secondary | ICD-10-CM

## 2012-11-04 DIAGNOSIS — D509 Iron deficiency anemia, unspecified: Secondary | ICD-10-CM

## 2012-11-04 DIAGNOSIS — E785 Hyperlipidemia, unspecified: Secondary | ICD-10-CM

## 2012-11-04 MED ORDER — POLYETHYLENE GLYCOL 3350 17 GM/SCOOP PO POWD: 17.0000 g | Freq: Every day | ORAL | Status: DC | PRN

## 2012-11-04 MED ORDER — FERROUS SULFATE 325 (65 FE) MG PO TABS: 325.0000 mg | ORAL_TABLET | Freq: Every day | ORAL | Status: AC

## 2012-11-04 NOTE — Progress Notes (Signed)
  Subjective:    Patient ID: Andrea Fox, female    DOB: 1932/04/17, 79 y.o.   MRN: [id number]  HPI [name] is here for routine follow up.  Has swollen feet with recent travel. She has had family events one after the other and has not been following diet or exercise regimen Denies any other complaints today Compliant with her medications bp well controlled this visit Has stopped taking her iron tablet due to constipation  Review of Systems  Constitutional: Negative for fever, chills, appetite change and fatigue.  HENT: Negative for congestion and mouth sores.   Eyes: Negative for visual disturbance.  Respiratory: Negative for cough and shortness of breath.   Cardiovascular: Positive for leg swelling. Negative for chest pain and palpitations.  Gastrointestinal: Negative for nausea, vomiting, abdominal pain, constipation and blood in stool.  Genitourinary: Negative for dysuria and pelvic pain.  Musculoskeletal: Negative for arthralgias.  Skin: Negative for rash and wound.  Neurological: Negative for weakness, numbness and headaches.  Hematological: Negative for adenopathy.  Psychiatric/Behavioral: Negative for confusion, sleep disturbance and agitation.       Objective:   Physical Exam  Constitutional: She is oriented to person, place, and time. She appears well-developed and well-nourished. No distress.  HENT:  Head: Normocephalic and atraumatic.  Eyes: Conjunctivae are normal. Pupils are equal, round, and reactive to light.  Neck: Normal range of motion. Neck supple. No JVD present.  Cardiovascular: Normal rate and regular rhythm.   Pulmonary/Chest: Effort normal and breath sounds normal.  Abdominal: Soft. Bowel sounds are normal.  Musculoskeletal: Normal range of motion. She exhibits edema.  Lymphadenopathy:    She has no cervical adenopathy.  Neurological: She is alert and oriented to person, place, and time.  Skin: Skin is warm and dry. She is not diaphoretic.   Psychiatric: She has a normal mood and affect.     BP 144/82  Pulse 61  Temp(Src) 97.7 F (36.5 C) (Oral)  Resp 14  Ht 5\' 7"  (1.702 m)  Wt 174 lb 3.2 oz (79.017 kg)  BMI 27.28 kg/m2  Labs reviewed  CBC    Component Value Date/Time   WBC 8.1 10/18/2011 1040   RBC 4.65 10/18/2011 1040   HGB 10.9* 10/18/2011 1040   HCT 34.6* 10/18/2011 1040   PLT 206 10/18/2011 1040   MCV 74.4* 10/18/2011 1040   MCH 23.4* 10/18/2011 1040   MCHC 31.5 10/18/2011 1040   RDW 15.2 10/18/2011 1040   LABS REVIEWED:  07/29/12- ekg- normal sinus rhythm, LAD, left anterior fascicular block 07/27/2012  CBC: wbc 7.5, rbc 4.64, Hemoglobin 11.0 CMP: glucose 97, BUN 12, Creatinine 0.73, Sodium 145 A1C: 6.1 TIBC 281, UIBC 201, Iron Serum 80, Iron Sat 28 Lipid: Cholesterol 162, Triglycerides 77, HDL 59, LDL 88 TSH: 2.100 Vitamin D: 41.8      Assessment & Plan:   HTN- bp better controlled. Continue hydralazine, hyzaar and toprol xl current regimen for now with statin and asa  Vit d def- continue vitamin d supplement  Hyperlipidemia- stable, continue statin  Iron def anemia- will resume ferrous sulfate 325 mg daily along with miralax to help prevent consitpation  Edema- in both legs upto ankle, likel from venous pooling during recent travel, limb elevation at rest should help. No cardiac symptoms on exam. Distal pulses are good

## 2012-11-11 DIAGNOSIS — D509 Iron deficiency anemia, unspecified: Secondary | ICD-10-CM | POA: Insufficient documentation

## 2012-11-11 DIAGNOSIS — I878 Other specified disorders of veins: Secondary | ICD-10-CM | POA: Insufficient documentation

## 2012-11-11 DIAGNOSIS — I10 Essential (primary) hypertension: Secondary | ICD-10-CM | POA: Insufficient documentation

## 2013-02-22 ENCOUNTER — Other Ambulatory Visit: Payer: Medicare Other

## 2013-02-22 DIAGNOSIS — I10 Essential (primary) hypertension: Secondary | ICD-10-CM

## 2013-02-22 DIAGNOSIS — D509 Iron deficiency anemia, unspecified: Secondary | ICD-10-CM

## 2013-02-23 LAB — CBC WITH DIFFERENTIAL/PLATELET
Basophils Absolute: 0 10*3/uL (ref 0.0–0.2)
Eosinophils Absolute: 0.1 10*3/uL (ref 0.0–0.4)
HCT: 35.3 % (ref 34.0–46.6)
Immature Grans (Abs): 0 10*3/uL (ref 0.0–0.1)
Immature Granulocytes: 0 %
Lymphs: 29 %
MCH: 23.8 pg — ABNORMAL LOW (ref 26.6–33.0)
MCHC: 32.3 g/dL (ref 31.5–35.7)
MCV: 74 fL — ABNORMAL LOW (ref 79–97)
Monocytes: 7 %
Neutrophils Relative %: 63 %
RBC: 4.78 x10E6/uL (ref 3.77–5.28)
RDW: 15.2 % (ref 12.3–15.4)
WBC: 8.2 10*3/uL (ref 3.4–10.8)

## 2013-02-23 LAB — BASIC METABOLIC PANEL
BUN: 14 mg/dL (ref 8–27)
CO2: 27 mmol/L (ref 18–29)
Chloride: 102 mmol/L (ref 97–108)
GFR calc Af Amer: 94 mL/min/{1.73_m2} (ref >59)
Glucose: 100 mg/dL — ABNORMAL HIGH (ref 65–99)
Potassium: 4.3 mmol/L (ref 3.5–5.2)

## 2013-02-24 ENCOUNTER — Encounter: Payer: Self-pay | Admitting: Internal Medicine

## 2013-02-24 ENCOUNTER — Ambulatory Visit (INDEPENDENT_AMBULATORY_CARE_PROVIDER_SITE_OTHER): Payer: Medicare Other | Admitting: Internal Medicine

## 2013-02-24 VITALS — BP 148/80 | HR 66 | Temp 96.6°F | Wt 171.0 lb

## 2013-02-24 DIAGNOSIS — I10 Essential (primary) hypertension: Secondary | ICD-10-CM

## 2013-02-24 DIAGNOSIS — K5900 Constipation, unspecified: Secondary | ICD-10-CM

## 2013-02-24 DIAGNOSIS — M899 Disorder of bone, unspecified: Secondary | ICD-10-CM

## 2013-02-24 DIAGNOSIS — M1710 Unilateral primary osteoarthritis, unspecified knee: Secondary | ICD-10-CM

## 2013-02-24 DIAGNOSIS — E785 Hyperlipidemia, unspecified: Secondary | ICD-10-CM

## 2013-02-24 DIAGNOSIS — D509 Iron deficiency anemia, unspecified: Secondary | ICD-10-CM

## 2013-02-24 DIAGNOSIS — M1711 Unilateral primary osteoarthritis, right knee: Secondary | ICD-10-CM

## 2013-02-24 NOTE — Progress Notes (Signed)
Patient ID: Andrea Fox, female   DOB: Mar 19, 1932, 79 y.o.   MRN: [id number]  Chief Complaint  Patient presents with  . Medical Managment of Chronic Issues    4 month follow-up on blood pressure and anemia    No Known Allergies  HPI Andrea Fox is here for routine follow up. She is travelling to [place] next week. She has been complaint with her medications. The swelling in her feet has resolved.  She complaints of pain in her right knee especially after climbing stairs. She also complaints of knee stiffness after sitting for long time Has been taking prune juice and bowel movement has been regular now Denies any other complaints today Compliant with her medications SBP slightly elevated this visit. bp at home SBP < 140 and DBP < 80.  Did not tolerate miralax well, thus stopped it   Review of Systems  Constitutional: Negative for fever, chills, appetite change and fatigue.  HENT: Negative for congestion and mouth sores.   Eyes: Negative for visual disturbance.  Respiratory: Negative for cough and shortness of breath.   Cardiovascular: Negative for chest pain and palpitations and leg edema Gastrointestinal: Negative for nausea, vomiting, abdominal pain, constipation and blood in stool.  Genitourinary: Negative for dysuria and pelvic pain.  Musculoskeletal: positive  for arthralgias in right knee Skin: Negative for rash and wound.  Neurological: Negative for weakness, numbness and headaches.  Hematological: Negative for adenopathy.  Psychiatric/Behavioral: Negative for confusion, sleep disturbance and agitation.   Past Medical History  Diagnosis Date  . Hypertension   . Hyperlipemia   . Disorder of bone and cartilage, unspecified   . Arthritis   . Anxiety   . Anemia   . Lung mass 10/2011  . Right bundle branch block and left posterior fascicular block   . Other abnormal blood chemistry   . Unspecified vitamin D deficiency   . Osteoarthrosis, unspecified whether generalized  or localized, lower leg   . Screening for iron deficiency anemia   . Localized superficial swelling, mass, or lump   . Screening for diabetes mellitus   . Essential hypertension, benign   . Routine gynecological examination 07/23/2011  . Special screening for malignant neoplasms, colon    Past Surgical History  Procedure Laterality Date  . Appendectomy  1953  . Back surgery  1975  . Knee surgery  1993  . Tumor in neck  2010    Dr Ellwood Sayers  . Excision lung mass  10/22/2011  . Mass excision  10/22/2011    Procedure: EXCISION MASS;  Surgeon: Ines Bloomer, MD;  Location: [facility] OR;  Service: Thoracic;  Laterality: Left;  EXCISION LEFT CHEST MASS   Current Outpatient Prescriptions on File Prior to Visit  Medication Sig Dispense Refill  . aspirin 81 MG tablet Take 81 mg by mouth daily.      . Cholecalciferol (VITAMIN D-3) 5000 UNITS TABS Take 1 tablet by mouth daily.      . ferrous sulfate 325 (65 FE) MG tablet Take 1 tablet (325 mg total) by mouth daily with breakfast.  30 tablet  3  . hydrALAZINE (APRESOLINE) 50 MG tablet Take 50 mg by mouth. Take one tablet  twice a day for blood pressure      . losartan-hydrochlorothiazide (HYZAAR) 100-25 MG per tablet Take 1 tablet by mouth daily. Take one tablet once a day for blood pressure      . lovastatin (MEVACOR) 20 MG tablet Take 20 mg by mouth at bedtime.  Take one tablet once a day for cholesterol      . metoprolol succinate (TOPROL-XL) 50 MG 24 hr tablet Take 50 mg by mouth daily. Blood preasure       No current facility-administered medications on file prior to visit.     BP 148/80  Pulse 66  Temp(Src) 96.6 F (35.9 C) (Oral)  Wt 171 lb (77.565 kg)  BMI 26.78 kg/m2  SpO2 99%  Physical Exam  Constitutional: She is oriented to person, place, and time. She appears well-developed and well-nourished. No distress.  HENT:   Head: Normocephalic and atraumatic.  Eyes: Conjunctivae are normal. Pupils are equal, round, and reactive  to light.  Neck: Normal range of motion. Neck supple. No JVD present.  Cardiovascular: Normal rate and regular rhythm.   Pulmonary/Chest: Effort normal and breath sounds normal.  Abdominal: Soft. Bowel sounds are normal.  Musculoskeletal: Normal range of motion. No edema. Some crepitus in both knees Lymphadenopathy:    She has no cervical adenopathy.  Neurological: She is alert and oriented to person, place, and time.  Skin: Skin is warm and dry. She is not diaphoretic.  Psychiatric: She has a normal mood and affect.   LABS REVIEWED:   07/29/12- ekg- normal sinus rhythm, LAD, left anterior fascicular block 07/27/2012  CBC: wbc 7.5, rbc 4.64, Hemoglobin 11.0 CMP: glucose 97, BUN 12, Creatinine 0.73, Sodium 145 A1C: 6.1 TIBC 281, UIBC 201, Iron Serum 80, Iron Sat 28 Lipid: Cholesterol 162, Triglycerides 77, HDL 59, LDL 88 TSH: 2.100 Vitamin D: 41.8  CBC    Component Value Date/Time   WBC 8.2 02/22/2013 0858   WBC 8.1 10/18/2011 1040   RBC 4.78 02/22/2013 0858   RBC 4.65 10/18/2011 1040   HGB 11.4 02/22/2013 0858   HCT 35.3 02/22/2013 0858   PLT 206 10/18/2011 1040   MCV 74* 02/22/2013 0858   MCH 23.8* 02/22/2013 0858   MCH 23.4* 10/18/2011 1040   MCHC 32.3 02/22/2013 0858   MCHC 31.5 10/18/2011 1040   RDW 15.2 02/22/2013 0858   RDW 15.2 10/18/2011 1040   LYMPHSABS 2.4 02/22/2013 0858   EOSABS 0.1 02/22/2013 0858   BASOSABS 0.0 02/22/2013 0858    CMP     Component Value Date/Time   NA 144 02/22/2013 0858   NA 139 10/18/2011 1040   K 4.3 02/22/2013 0858   CL 102 02/22/2013 0858   CO2 27 02/22/2013 0858   GLUCOSE 100* 02/22/2013 0858   GLUCOSE 88 10/18/2011 1040   BUN 14 02/22/2013 0858   BUN 13 10/18/2011 1040   CREATININE 0.71 02/22/2013 0858   CALCIUM 10.5* 02/22/2013 0858   PROT 7.4 10/18/2011 1040   ALBUMIN 4.1 10/18/2011 1040   AST 15 10/18/2011 1040   ALT 14 10/18/2011 1040   ALKPHOS 72 10/18/2011 1040   BILITOT 0.4 10/18/2011 1040   GFRNONAA 82 02/22/2013 0858   GFRAA 94 02/22/2013 0858       Assessment/ plan  Osteoarthritis- right knee OA, pt not taking any pain medication. Suggested to take tylenol or ibuprofen on as needed basis for now for the pain. If no relief with this and heat pack, to notify  HTN- bp better controlled from readings at home. Continue hydralazine, hyzaar and toprol xl current regimen for now with statin and asa. Reviewed BMP.   Iron def anemia- continue ferrous sulfate 325 mg daily along with prune juice to prevent constipation  Hyperlipidemia- stable, continue statin  Vit d def- continue vitamin d supplement  Constipation- her iron supplement could be contributing som. Prune juice has been helpful continue this  Does not want influenza vaccine this visit

## 2013-07-08 ENCOUNTER — Other Ambulatory Visit: Payer: Self-pay | Admitting: *Deleted

## 2013-07-08 MED ORDER — LOVASTATIN 20 MG PO TABS: ORAL_TABLET | ORAL | Status: AC

## 2013-07-08 MED ORDER — HYDRALAZINE HCL 50 MG PO TABS: ORAL_TABLET | ORAL | Status: DC

## 2013-07-28 ENCOUNTER — Encounter: Payer: Medicare Other | Admitting: Internal Medicine

## 2013-07-28 ENCOUNTER — Encounter: Payer: Self-pay | Admitting: Internal Medicine

## 2013-07-28 ENCOUNTER — Ambulatory Visit (INDEPENDENT_AMBULATORY_CARE_PROVIDER_SITE_OTHER): Payer: Medicare Other | Admitting: Internal Medicine

## 2013-07-28 VITALS — BP 148/78 | HR 65 | Temp 97.4°F | Resp 20 | Ht 67.0 in | Wt 178.6 lb

## 2013-07-28 DIAGNOSIS — H6121 Impacted cerumen, right ear: Secondary | ICD-10-CM | POA: Insufficient documentation

## 2013-07-28 DIAGNOSIS — D509 Iron deficiency anemia, unspecified: Secondary | ICD-10-CM

## 2013-07-28 DIAGNOSIS — H269 Unspecified cataract: Secondary | ICD-10-CM | POA: Insufficient documentation

## 2013-07-28 DIAGNOSIS — I452 Bifascicular block: Secondary | ICD-10-CM

## 2013-07-28 DIAGNOSIS — H6120 Impacted cerumen, unspecified ear: Secondary | ICD-10-CM

## 2013-07-28 DIAGNOSIS — E785 Hyperlipidemia, unspecified: Secondary | ICD-10-CM

## 2013-07-28 DIAGNOSIS — I10 Essential (primary) hypertension: Secondary | ICD-10-CM

## 2013-07-28 DIAGNOSIS — M899 Disorder of bone, unspecified: Secondary | ICD-10-CM

## 2013-07-28 DIAGNOSIS — M949 Disorder of cartilage, unspecified: Secondary | ICD-10-CM

## 2013-07-28 DIAGNOSIS — Z0000 Encounter for general adult medical examination without abnormal findings: Secondary | ICD-10-CM | POA: Insufficient documentation

## 2013-07-28 MED ORDER — LOSARTAN POTASSIUM-HCTZ 100-25 MG PO TABS: 1.0000 | ORAL_TABLET | Freq: Every day | ORAL | Status: AC

## 2013-07-28 MED ORDER — HYDRALAZINE HCL 50 MG PO TABS: ORAL_TABLET | ORAL | Status: DC

## 2013-07-28 MED ORDER — LOSARTAN POTASSIUM-HCTZ 100-25 MG PO TABS: 1.0000 | ORAL_TABLET | Freq: Every day | ORAL | Status: DC

## 2013-07-28 NOTE — Progress Notes (Signed)
Patient ID: Andrea Fox, female   DOB: December 01, 1931, 80 y.o.   MRN: [id number]     Chief Complaint  Patient presents with  . Annual Exam   No Known Allergies  HPI 80 y/o female pt here for her annual exam Does not want pelvic and breast exam. Agrees to get her mammogram.  Her son is going through heart surgery and this is stressing her out Does not want influenza and shingles vaccine Saw her eye doctor recently  Review of Systems  Constitutional: Negative for fever, chills, weight loss, malaise/fatigue and diaphoresis.  HENT: Negative for congestion, hearing loss and sore throat.   Eyes: Negative for blurred vision, double vision and discharge. wears glasses. Has cataract Respiratory: Negative for cough, sputum production, shortness of breath and wheezing.   Cardiovascular: Negative for chest pain, palpitations, orthopnea and leg swelling.  Gastrointestinal: Negative for heartburn, nausea, vomiting, abdominal pain, diarrhea and constipation.  Genitourinary: Negative for dysuria, urgency, frequency and flank pain.  Musculoskeletal: Negative for back pain, falls, myalgias. ocassional joint aches Skin: Negative for itching and rash.  Neurological: Negative for dizziness, tingling, focal weakness and headaches.  Psychiatric/Behavioral: Negative for depression and memory loss. The patient is not nervous/anxious.    Past Medical History  Diagnosis Date  . Hypertension   . Hyperlipemia   . Disorder of bone and cartilage, unspecified   . Arthritis   . Anxiety   . Anemia   . Lung mass 10/2011  . Right bundle branch block and left posterior fascicular block   . Other abnormal blood chemistry   . Unspecified vitamin D deficiency   . Osteoarthrosis, unspecified whether generalized or localized, lower leg   . Screening for iron deficiency anemia   . Localized superficial swelling, mass, or lump   . Screening for diabetes mellitus   . Essential hypertension, benign   . Routine  gynecological examination 07/23/2011  . Special screening for malignant neoplasms, colon    Past Surgical History  Procedure Laterality Date  . Appendectomy  1953  . Back surgery  1975  . Knee surgery  1993  . Tumor in neck  2010    Dr Stephani Police  . Excision lung mass  10/22/2011  . Mass excision  10/22/2011    Procedure: EXCISION MASS;  Surgeon: Nicanor Alcon, MD;  Location: [facility];  Service: Thoracic;  Laterality: Left;  EXCISION LEFT CHEST MASS   Current Outpatient Prescriptions on File Prior to Visit  Medication Sig Dispense Refill  . aspirin 81 MG tablet Take 81 mg by mouth daily.      . Cholecalciferol (VITAMIN D-3) 5000 UNITS TABS Take 1 tablet by mouth daily.      . ferrous sulfate 325 (65 FE) MG tablet Take 1 tablet (325 mg total) by mouth daily with breakfast.  30 tablet  3  . hydrALAZINE (APRESOLINE) 50 MG tablet Take one tablet  twice a day for blood pressure  180 tablet  3  . losartan-hydrochlorothiazide (HYZAAR) 100-25 MG per tablet Take 1 tablet by mouth daily. Take one tablet once a day for blood pressure      . lovastatin (MEVACOR) 20 MG tablet Take one tablet once a day for cholesterol  90 tablet  3  . metoprolol succinate (TOPROL-XL) 50 MG 24 hr tablet Take 50 mg by mouth daily. Blood preasure       No current facility-administered medications on file prior to visit.   Family History  Problem Relation Age of Onset  .  Heart disease Mother   . Diabetes Sister   . Heart disease Son    History   Social History  . Marital Status: Widowed    Spouse Name: Jolin Kain    Number of Children: N/A  . Years of Education: N/A   Occupational History  . Social worker    Social History Main Topics  . Smoking status: Never Smoker   . Smokeless tobacco: Never Used  . Alcohol Use: No  . Drug Use: No  . Sexual Activity: No   Other Topics Concern  . Not on file   Social History Narrative   Lives in apt[address]Condo. Pt lives alone   Physical exam BP  148/78  Pulse 65  Temp(Src) 97.4 F (36.3 C) (Oral)  Resp 20  Ht 5\' 7"  (1.702 m)  Wt 178 lb 9.6 oz (81.012 kg)  BMI 27.97 kg/m2  SpO2 99%  General- elderly female in no acute distress Head- atraumatic, normocephalic Eyes- PERRLA, EOMI, no pallor, no icterus, no discharge Neck- no lymphadenopathy, no thyromegaly, no jugular vein distension, no carotid bruit Ears- left ear normal tympanic membrane and normal external ear canal , right ear impacted cerumen Chest- no chest wall deformities, no chest wall tenderness Breast- refused Cardiovascular- normal s1,s2, no murmurs/ rubs/ gallops Respiratory- bilateral clear to auscultation, no wheeze, no rhonchi, no crackles Abdomen- bowel sounds present, soft, non tender, no organomegaly, no abdominal bruits, no guarding or rigidity, no CVA tenderness Pelvic exam- refused Musculoskeletal- able to move all 4 extremities, no spinal and paraspinal tenderness, steady gait, no use of assistive device Neurological- no focal deficit, normal reflexes, normal muscle strength, normal sensation to fine touch and vibration Psychiatry- alert and oriented to person, place and time, normal mood and affect  Labs- CBC    Component Value Date/Time   WBC 8.2 02/22/2013 0858   WBC 8.1 10/18/2011 1040   RBC 4.78 02/22/2013 0858   RBC 4.65 10/18/2011 1040   HGB 11.4 02/22/2013 0858   HCT 35.3 02/22/2013 0858   PLT 206 10/18/2011 1040   MCV 74* 02/22/2013 0858   MCH 23.8* 02/22/2013 0858   MCH 23.4* 10/18/2011 1040   MCHC 32.3 02/22/2013 0858   MCHC 31.5 10/18/2011 1040   RDW 15.2 02/22/2013 0858   RDW 15.2 10/18/2011 1040   LYMPHSABS 2.4 02/22/2013 0858   EOSABS 0.1 02/22/2013 0858   BASOSABS 0.0 02/22/2013 0858    CMP     Component Value Date/Time   NA 144 02/22/2013 0858   NA 139 10/18/2011 1040   K 4.3 02/22/2013 0858   CL 102 02/22/2013 0858   CO2 27 02/22/2013 0858   GLUCOSE 100* 02/22/2013 0858   GLUCOSE 88 10/18/2011 1040   BUN 14 02/22/2013 0858   BUN 13  10/18/2011 1040   CREATININE 0.71 02/22/2013 0858   CALCIUM 10.5* 02/22/2013 0858   PROT 7.4 10/18/2011 1040   ALBUMIN 4.1 10/18/2011 1040   AST 15 10/18/2011 1040   ALT 14 10/18/2011 1040   ALKPHOS 72 10/18/2011 1040   BILITOT 0.4 10/18/2011 1040   GFRNONAA 82 02/22/2013 0858   GFRAA 94 02/22/2013 0858    Assessment/plan  1. Hypertension Improved bp reading at home will decrease hydralazine to 25 mg in pm and continue 50 mg in am. Continue hyzaar and toprol xl current dosing. Check cmp  2. Disorder of bone and cartilage, unspecified Continue vitamin d supplement  3. Hyperlipemia Check lipid panel. Continue lovastatin  4. Anemia, iron deficiency Continue ferrous  sulfate and check cbc  5. Routine general medical examination at a health care facility Has upcoming mammogram. Due for repeat colonoscopy in 2017. Follows with Gyn. Will schedule dexa scan and routine labs. Dietary and exercise counselling  6. Cataract Will need to follow with her ophthalmology  7. Impacted cerumen of right ear Debrox ear drop bid x 5 days  8. Bifascicular bundle branch block New bifascicular block with RBBB and RVH noted on ekg which is new from 2014 EKG. Will get echocardiogram to assess valvular function and pulmonary hypertension  Labs- ordered with echocardiogram  Blanchie Serve, MD  [facility] [phone] (Monday-Friday 8 am - 5 pm) [phone] (afterhours)

## 2013-07-29 LAB — COMPREHENSIVE METABOLIC PANEL
A/G RATIO: 1.8 (ref 1.1–2.5)
ALBUMIN: 4.8 g/dL (ref 3.5–4.8)
ALK PHOS: 84 IU/L (ref 39–117)
ALT: 18 IU/L (ref 0–32)
AST: 15 IU/L (ref 0–40)
BILIRUBIN TOTAL: 0.3 mg/dL (ref 0.0–1.2)
BUN / CREAT RATIO: 19 (ref 11–26)
BUN: 13 mg/dL (ref 8–27)
CHLORIDE: 101 mmol/L (ref 97–108)
CO2: 27 mmol/L (ref 18–29)
CREATININE: 0.69 mg/dL (ref 0.57–1.00)
Calcium: 10.6 mg/dL — ABNORMAL HIGH (ref 8.7–10.3)
GFR calc non Af Amer: 83 mL/min/{1.73_m2} (ref >59)
GFR, EST AFRICAN AMERICAN: 96 mL/min/{1.73_m2} (ref >59)
GLOBULIN, TOTAL: 2.6 g/dL (ref 1.5–4.5)
Glucose: 91 mg/dL (ref 65–99)
Potassium: 4.2 mmol/L (ref 3.5–5.2)
Sodium: 143 mmol/L (ref 134–144)
Total Protein: 7.4 g/dL (ref 6.0–8.5)

## 2013-07-29 LAB — CBC WITH DIFFERENTIAL/PLATELET
BASOS ABS: 0 10*3/uL (ref 0.0–0.2)
Basos: 0 %
Eos: 1 %
Eosinophils Absolute: 0 10*3/uL (ref 0.0–0.4)
HEMATOCRIT: 35.3 % (ref 34.0–46.6)
HEMOGLOBIN: 11.1 g/dL (ref 11.1–15.9)
Immature Grans (Abs): 0 10*3/uL (ref 0.0–0.1)
Immature Granulocytes: 0 %
LYMPHS ABS: 2.3 10*3/uL (ref 0.7–3.1)
Lymphs: 28 %
MCH: 23.7 pg — AB (ref 26.6–33.0)
MCHC: 31.4 g/dL — AB (ref 31.5–35.7)
MCV: 75 fL — ABNORMAL LOW (ref 79–97)
MONOCYTES: 6 %
Monocytes Absolute: 0.5 10*3/uL (ref 0.1–0.9)
NEUTROS ABS: 5.4 10*3/uL (ref 1.4–7.0)
Neutrophils Relative %: 65 %
RBC: 4.69 x10E6/uL (ref 3.77–5.28)
RDW: 15.9 % — ABNORMAL HIGH (ref 12.3–15.4)
WBC: 8.3 10*3/uL (ref 3.4–10.8)

## 2013-07-29 LAB — LIPID PANEL
CHOL/HDL RATIO: 3.1 ratio (ref 0.0–4.4)
Cholesterol, Total: 170 mg/dL (ref 100–199)
HDL: 55 mg/dL (ref >39)
LDL CALC: 97 mg/dL (ref 0–99)
Triglycerides: 91 mg/dL (ref 0–149)
VLDL Cholesterol Cal: 18 mg/dL (ref 5–40)

## 2013-08-02 ENCOUNTER — Telehealth: Payer: Self-pay | Admitting: *Deleted

## 2013-08-02 NOTE — Telephone Encounter (Signed)
PT/INR results were 28.7/2.4 Please advise. thanks

## 2013-08-03 NOTE — Telephone Encounter (Signed)
Is this the right patient?  I don't see where she is on any coumadin in her med list?  Was the PT/INR done for another reason?  It looks like Dr. Bubba Camp is her PCP.

## 2013-08-04 NOTE — Telephone Encounter (Signed)
Sorry this is the wrong patient.

## 2013-08-10 ENCOUNTER — Ambulatory Visit (HOSPITAL_COMMUNITY)
Admission: RE | Admit: 2013-08-10 | Discharge: 2013-08-10 | Disposition: A | Discharge status: Home / Self Care | Payer: Medicare Other | Source: Ambulatory Visit | Attending: Internal Medicine | Admitting: Internal Medicine

## 2013-08-10 DIAGNOSIS — I452 Bifascicular block: Secondary | ICD-10-CM

## 2013-08-10 DIAGNOSIS — I10 Essential (primary) hypertension: Secondary | ICD-10-CM | POA: Insufficient documentation

## 2013-08-10 DIAGNOSIS — I059 Rheumatic mitral valve disease, unspecified: Secondary | ICD-10-CM | POA: Insufficient documentation

## 2013-08-10 DIAGNOSIS — R9431 Abnormal electrocardiogram [ECG] [EKG]: Secondary | ICD-10-CM | POA: Insufficient documentation

## 2013-08-10 DIAGNOSIS — E785 Hyperlipidemia, unspecified: Secondary | ICD-10-CM | POA: Insufficient documentation

## 2013-08-10 NOTE — Progress Notes (Signed)
Echo Lab  2D Echocardiogram completed.  [place], RDCS 08/10/2013 1:13 PM

## 2013-08-12 ENCOUNTER — Other Ambulatory Visit: Payer: Self-pay

## 2013-08-12 ENCOUNTER — Ambulatory Visit
Admission: RE | Admit: 2013-08-12 | Discharge: 2013-08-12 | Disposition: A | Discharge status: Home / Self Care | Payer: 59 | Source: Ambulatory Visit | Attending: Internal Medicine | Admitting: Internal Medicine

## 2013-08-12 DIAGNOSIS — M899 Disorder of bone, unspecified: Secondary | ICD-10-CM

## 2013-08-12 DIAGNOSIS — M949 Disorder of cartilage, unspecified: Principal | ICD-10-CM

## 2013-08-12 DIAGNOSIS — Z1231 Encounter for screening mammogram for malignant neoplasm of breast: Secondary | ICD-10-CM

## 2013-08-13 ENCOUNTER — Encounter: Payer: Self-pay | Admitting: Internal Medicine

## 2013-08-16 ENCOUNTER — Encounter: Payer: Self-pay | Admitting: *Deleted

## 2013-08-31 ENCOUNTER — Encounter: Payer: Self-pay | Admitting: Internal Medicine

## 2013-09-01 ENCOUNTER — Ambulatory Visit
Admission: RE | Admit: 2013-09-01 | Discharge: 2013-09-01 | Disposition: A | Discharge status: Home / Self Care | Payer: 59 | Source: Ambulatory Visit

## 2013-09-01 DIAGNOSIS — Z1231 Encounter for screening mammogram for malignant neoplasm of breast: Secondary | ICD-10-CM

## 2013-09-16 ENCOUNTER — Encounter: Payer: Self-pay | Admitting: Internal Medicine

## 2013-09-18 ENCOUNTER — Emergency Department (HOSPITAL_COMMUNITY): Payer: Medicare Other

## 2013-09-18 ENCOUNTER — Encounter (HOSPITAL_COMMUNITY): Payer: Self-pay | Admitting: Emergency Medicine

## 2013-09-18 ENCOUNTER — Inpatient Hospital Stay (HOSPITAL_COMMUNITY): Payer: Medicare Other

## 2013-09-18 ENCOUNTER — Inpatient Hospital Stay (HOSPITAL_COMMUNITY)
Admission: EM | Admit: 2013-09-18 | Discharge: 2013-10-08 | DRG: 085 | Disposition: E | Payer: Medicare Other | Attending: Pulmonary Disease | Admitting: Pulmonary Disease

## 2013-09-18 DIAGNOSIS — E7800 Pure hypercholesterolemia, unspecified: Secondary | ICD-10-CM | POA: Diagnosis present

## 2013-09-18 DIAGNOSIS — I469 Cardiac arrest, cause unspecified: Secondary | ICD-10-CM | POA: Diagnosis not present

## 2013-09-18 DIAGNOSIS — J9600 Acute respiratory failure, unspecified whether with hypoxia or hypercapnia: Secondary | ICD-10-CM

## 2013-09-18 DIAGNOSIS — D72829 Elevated white blood cell count, unspecified: Secondary | ICD-10-CM | POA: Diagnosis present

## 2013-09-18 DIAGNOSIS — I6350 Cerebral infarction due to unspecified occlusion or stenosis of unspecified cerebral artery: Secondary | ICD-10-CM | POA: Diagnosis present

## 2013-09-18 DIAGNOSIS — G936 Cerebral edema: Secondary | ICD-10-CM | POA: Diagnosis present

## 2013-09-18 DIAGNOSIS — E870 Hyperosmolality and hypernatremia: Secondary | ICD-10-CM | POA: Diagnosis present

## 2013-09-18 DIAGNOSIS — I10 Essential (primary) hypertension: Secondary | ICD-10-CM | POA: Diagnosis present

## 2013-09-18 DIAGNOSIS — Z7982 Long term (current) use of aspirin: Secondary | ICD-10-CM

## 2013-09-18 DIAGNOSIS — I959 Hypotension, unspecified: Secondary | ICD-10-CM | POA: Diagnosis present

## 2013-09-18 DIAGNOSIS — R4020 Unspecified coma: Secondary | ICD-10-CM | POA: Diagnosis present

## 2013-09-18 DIAGNOSIS — E785 Hyperlipidemia, unspecified: Secondary | ICD-10-CM | POA: Diagnosis present

## 2013-09-18 DIAGNOSIS — M1710 Unilateral primary osteoarthritis, unspecified knee: Secondary | ICD-10-CM | POA: Diagnosis present

## 2013-09-18 DIAGNOSIS — Y92009 Unspecified place in unspecified non-institutional (private) residence as the place of occurrence of the external cause: Secondary | ICD-10-CM

## 2013-09-18 DIAGNOSIS — M899 Disorder of bone, unspecified: Secondary | ICD-10-CM | POA: Diagnosis present

## 2013-09-18 DIAGNOSIS — E876 Hypokalemia: Secondary | ICD-10-CM | POA: Diagnosis present

## 2013-09-18 DIAGNOSIS — G935 Compression of brain: Secondary | ICD-10-CM | POA: Diagnosis present

## 2013-09-18 DIAGNOSIS — S065X9A Traumatic subdural hemorrhage with loss of consciousness of unspecified duration, initial encounter: Principal | ICD-10-CM | POA: Diagnosis present

## 2013-09-18 DIAGNOSIS — I452 Bifascicular block: Secondary | ICD-10-CM | POA: Diagnosis present

## 2013-09-18 DIAGNOSIS — G9382 Brain death: Secondary | ICD-10-CM | POA: Diagnosis not present

## 2013-09-18 DIAGNOSIS — W19XXXA Unspecified fall, initial encounter: Secondary | ICD-10-CM | POA: Diagnosis present

## 2013-09-18 DIAGNOSIS — S065XAA Traumatic subdural hemorrhage with loss of consciousness status unknown, initial encounter: Principal | ICD-10-CM | POA: Diagnosis present

## 2013-09-18 DIAGNOSIS — R7309 Other abnormal glucose: Secondary | ICD-10-CM | POA: Diagnosis present

## 2013-09-18 DIAGNOSIS — I619 Nontraumatic intracerebral hemorrhage, unspecified: Secondary | ICD-10-CM | POA: Diagnosis present

## 2013-09-18 DIAGNOSIS — M949 Disorder of cartilage, unspecified: Secondary | ICD-10-CM

## 2013-09-18 LAB — I-STAT ARTERIAL BLOOD GAS, ED
ACID-BASE EXCESS: 2 mmol/L (ref 0.0–2.0)
Bicarbonate: 26.2 mEq/L — ABNORMAL HIGH (ref 20.0–24.0)
O2 Saturation: 100 %
PO2 ART: 178 mmHg — AB (ref 80.0–100.0)
Patient temperature: 93.9
TCO2: 27 mmol/L (ref 0–100)
pCO2 arterial: 36 mmHg (ref 35.0–45.0)
pH, Arterial: 7.46 — ABNORMAL HIGH (ref 7.350–7.450)

## 2013-09-18 LAB — URINALYSIS, ROUTINE W REFLEX MICROSCOPIC
Bilirubin Urine: NEGATIVE
GLUCOSE, UA: NEGATIVE mg/dL
Hgb urine dipstick: NEGATIVE
KETONES UR: NEGATIVE mg/dL
Leukocytes, UA: NEGATIVE
NITRITE: NEGATIVE
PH: 5.5 (ref 5.0–8.0)
Protein, ur: >300 mg/dL — AB
SPECIFIC GRAVITY, URINE: 1.03 (ref 1.005–1.030)
Urobilinogen, UA: 0.2 mg/dL (ref 0.0–1.0)

## 2013-09-18 LAB — COMPREHENSIVE METABOLIC PANEL
ALBUMIN: 4 g/dL (ref 3.5–5.2)
ALT: 14 U/L (ref 0–35)
AST: 18 U/L (ref 0–37)
Alkaline Phosphatase: 73 U/L (ref 39–117)
BUN: 13 mg/dL (ref 6–23)
CHLORIDE: 103 meq/L (ref 96–112)
CO2: 24 meq/L (ref 19–32)
CREATININE: 0.71 mg/dL (ref 0.50–1.10)
Calcium: 9.9 mg/dL (ref 8.4–10.5)
GFR calc Af Amer: >90 mL/min (ref >90)
GFR, EST NON AFRICAN AMERICAN: 80 mL/min — AB (ref >90)
Glucose, Bld: 184 mg/dL — ABNORMAL HIGH (ref 70–99)
Potassium: 3.2 mEq/L — ABNORMAL LOW (ref 3.7–5.3)
SODIUM: 144 meq/L (ref 137–147)
Total Bilirubin: 0.5 mg/dL (ref 0.3–1.2)
Total Protein: 7.3 g/dL (ref 6.0–8.3)

## 2013-09-18 LAB — CBC WITH DIFFERENTIAL/PLATELET
Basophils Absolute: 0 10*3/uL (ref 0.0–0.1)
Basophils Relative: 0 % (ref 0–1)
Eosinophils Absolute: 0.1 10*3/uL (ref 0.0–0.7)
Eosinophils Relative: 0 % (ref 0–5)
HEMATOCRIT: 34.2 % — AB (ref 36.0–46.0)
HEMOGLOBIN: 10.8 g/dL — AB (ref 12.0–15.0)
LYMPHS ABS: 4 10*3/uL (ref 0.7–4.0)
Lymphocytes Relative: 28 % (ref 12–46)
MCH: 23.9 pg — ABNORMAL LOW (ref 26.0–34.0)
MCHC: 31.6 g/dL (ref 30.0–36.0)
MCV: 75.8 fL — ABNORMAL LOW (ref 78.0–100.0)
MONO ABS: 0.5 10*3/uL (ref 0.1–1.0)
MONOS PCT: 4 % (ref 3–12)
NEUTROS ABS: 9.6 10*3/uL — AB (ref 1.7–7.7)
NEUTROS PCT: 68 % (ref 43–77)
Platelets: 218 10*3/uL (ref 150–400)
RBC: 4.51 MIL/uL (ref 3.87–5.11)
RDW: 15.6 % — ABNORMAL HIGH (ref 11.5–15.5)
WBC: 14.2 10*3/uL — ABNORMAL HIGH (ref 4.0–10.5)

## 2013-09-18 LAB — MRSA PCR SCREENING: MRSA by PCR: NEGATIVE

## 2013-09-18 LAB — TROPONIN I
Troponin I: <0.3 ng/mL (ref <0.30)
Troponin I: <0.3 ng/mL (ref <0.30)

## 2013-09-18 LAB — PROTIME-INR
INR: 0.97 (ref 0.00–1.49)
Prothrombin Time: 12.7 seconds (ref 11.6–15.2)

## 2013-09-18 LAB — CBG MONITORING, ED: GLUCOSE-CAPILLARY: 161 mg/dL — AB (ref 70–99)

## 2013-09-18 LAB — I-STAT CG4 LACTIC ACID, ED: LACTIC ACID, VENOUS: 2.66 mmol/L — AB (ref 0.5–2.2)

## 2013-09-18 LAB — URINE MICROSCOPIC-ADD ON

## 2013-09-18 LAB — SODIUM
Sodium: 148 mEq/L — ABNORMAL HIGH (ref 137–147)
Sodium: 154 mEq/L — ABNORMAL HIGH (ref 137–147)

## 2013-09-18 LAB — PRO B NATRIURETIC PEPTIDE: PRO B NATRI PEPTIDE: 51.3 pg/mL (ref 0–450)

## 2013-09-18 MED ORDER — PROPOFOL 10 MG/ML IV EMUL
5.0000 ug/kg/min | Freq: Once | INTRAVENOUS | Status: AC
  Administered 2013-09-18: 20 ug/kg/min via INTRAVENOUS

## 2013-09-18 MED ORDER — PROPOFOL 10 MG/ML IV EMUL
INTRAVENOUS | Status: AC
  Administered 2013-09-18: 20 ug/kg/min
  Filled 2013-09-18: qty 100

## 2013-09-18 MED ORDER — SODIUM CHLORIDE 4 MEQ/ML IV SOLN
30.0000 mL | INTRAVENOUS | Status: AC
  Administered 2013-09-18: 120 meq via INTRAVENOUS
  Filled 2013-09-18 (×3): qty 30

## 2013-09-18 MED ORDER — SODIUM CHLORIDE 0.9 % IV SOLN: 250.0000 mL | INTRAVENOUS | Status: DC | PRN

## 2013-09-18 MED ORDER — SODIUM CHLORIDE 0.9 % IV SOLN
INTRAVENOUS | Status: DC
  Administered 2013-09-18: 12:00:00 via INTRAVENOUS

## 2013-09-18 MED ORDER — ETOMIDATE 2 MG/ML IV SOLN
0.3000 mg/kg | Freq: Once | INTRAVENOUS | Status: AC
  Administered 2013-09-18: 10 mg via INTRAVENOUS

## 2013-09-18 MED ORDER — FAMOTIDINE IN NACL 20-0.9 MG/50ML-% IV SOLN
20.0000 mg | Freq: Two times a day (BID) | INTRAVENOUS | Status: DC
  Administered 2013-09-18 – 2013-09-20 (×5): 20 mg via INTRAVENOUS
  Filled 2013-09-18 (×7): qty 50

## 2013-09-18 MED ORDER — CHLORHEXIDINE GLUCONATE 0.12 % MT SOLN
15.0000 mL | Freq: Two times a day (BID) | OROMUCOSAL | Status: DC
  Administered 2013-09-18 – 2013-09-21 (×7): 15 mL via OROMUCOSAL
  Filled 2013-09-18 (×6): qty 15

## 2013-09-18 MED ORDER — SODIUM CHLORIDE 0.9 % IV SOLN
1.5000 g | Freq: Three times a day (TID) | INTRAVENOUS | Status: DC
  Administered 2013-09-18 – 2013-09-20 (×6): 1.5 g via INTRAVENOUS
  Filled 2013-09-18 (×9): qty 1.5

## 2013-09-18 MED ORDER — PANTOPRAZOLE SODIUM 40 MG IV SOLR: 40.0000 mg | Freq: Every day | INTRAVENOUS | Status: DC

## 2013-09-18 MED ORDER — SODIUM CHLORIDE 3 % IV SOLN: INTRAVENOUS | Status: DC

## 2013-09-18 MED ORDER — POTASSIUM CHLORIDE 10 MEQ/100ML IV SOLN
10.0000 meq | INTRAVENOUS | Status: AC
Start: 2013-09-18 — End: 2013-09-18
  Administered 2013-09-18 (×4): 10 meq via INTRAVENOUS
  Filled 2013-09-18 (×4): qty 100

## 2013-09-18 MED ORDER — SODIUM CHLORIDE 3 % IV SOLN
INTRAVENOUS | Status: DC
  Administered 2013-09-18: 22:00:00 via INTRAVENOUS
  Administered 2013-09-18: 75 mL/h via INTRAVENOUS
  Filled 2013-09-18 (×9): qty 500

## 2013-09-18 MED ORDER — PROPOFOL 10 MG/ML IV EMUL: 5.0000 ug/kg/min | INTRAVENOUS | Status: DC

## 2013-09-18 MED ORDER — BIOTENE DRY MOUTH MT LIQD
15.0000 mL | Freq: Two times a day (BID) | OROMUCOSAL | Status: DC
  Administered 2013-09-19 – 2013-09-21 (×6): 15 mL via OROMUCOSAL

## 2013-09-18 MED ORDER — ROCURONIUM BROMIDE 50 MG/5ML IV SOLN
1.0000 mg/kg | Freq: Once | INTRAVENOUS | Status: AC
  Administered 2013-09-18: 100 mg via INTRAVENOUS

## 2013-09-18 MED ORDER — SODIUM CHLORIDE 4 MEQ/ML IV SOLN
30.0000 mL | INTRAVENOUS | Status: DC
  Filled 2013-09-18: qty 30

## 2013-09-18 NOTE — H&P (Signed)
PULMONARY / CRITICAL CARE MEDICINE   Name: Andrea Fox MRN: [id number] DOB: 12-May-1932    ADMISSION DATE:  10/02/2013  REFERRING MD :  Trenton Gammon PRIMARY SERVICE: PCCM  CHIEF COMPLAINT:  Stroke, ICH  BRIEF PATIENT DESCRIPTION: 80 yo female with hx HTN found down 4/11.  Intubated in ER for airway protection.  CT head revealed large hemorrhage with mass effect.  Seen by nsgy, no surgical intervention planned. PCCM called to admit.   SIGNIFICANT EVENTS / STUDIES:  CT head 4/11>>> large intraparenchymal L post parietal/occipital hemorrhage with associated small to moderate-sized left-sided subdural hematoma and mass effect with approximately 1.5 cm of left-to-right midline shift and uncal herniation.    LINES / TUBES: ETT 4/11>>>  CULTURES: none  ANTIBIOTICS: Unasyn 4/11>>>  HISTORY OF PRESENT ILLNESS:  80 yo female with hx HTN, otherwise healthy, in usual state of health until she was found unconscious outside her apartment 4/11.  On no anticoagulation at home.  No known hx trauma.  Intubated in ER.  CT results as above.    PAST MEDICAL HISTORY :  Past Medical History  Diagnosis Date  . Hypertension   . Hyperlipemia   . Disorder of bone and cartilage, unspecified   . Arthritis   . Anxiety   . Anemia   . Lung mass 10/2011  . Right bundle branch block and left posterior fascicular block   . Other abnormal blood chemistry   . Unspecified vitamin D deficiency   . Osteoarthrosis, unspecified whether generalized or localized, lower leg   . Screening for iron deficiency anemia   . Localized superficial swelling, mass, or lump   . Screening for diabetes mellitus   . Essential hypertension, benign   . Routine gynecological examination 07/23/2011  . Special screening for malignant neoplasms, colon    Past Surgical History  Procedure Laterality Date  . Appendectomy  1953  . Back surgery  1975  . Knee surgery  1993  . Tumor in neck  2010    Dr Stephani Police  . Excision lung  mass  10/22/2011  . Mass excision  10/22/2011    Procedure: EXCISION MASS;  Surgeon: Nicanor Alcon, MD;  Location: [facility];  Service: Thoracic;  Laterality: Left;  EXCISION LEFT CHEST MASS   Prior to Admission medications   Medication Sig Start Date End Date Taking? Authorizing Provider  aspirin 81 MG tablet Take 81 mg by mouth daily.    Historical Provider, MD  Cholecalciferol (VITAMIN D-3) 5000 UNITS TABS Take 1 tablet by mouth daily.    Historical Provider, MD  ferrous sulfate 325 (65 FE) MG tablet Take 1 tablet (325 mg total) by mouth daily with breakfast. 11/04/12   Blanchie Serve, MD  hydrALAZINE (APRESOLINE) 50 MG tablet Take one tablet  In am and half a tablet in pm 07/28/13   Blanchie Serve, MD  losartan-hydrochlorothiazide (HYZAAR) 100-25 MG per tablet Take 1 tablet by mouth daily. Take one tablet once a day for blood pressure 07/28/13   Blanchie Serve, MD  lovastatin (MEVACOR) 20 MG tablet Take one tablet once a day for cholesterol 07/08/13   Pricilla Larsson, NP  metoprolol succinate (TOPROL-XL) 50 MG 24 hr tablet Take 50 mg by mouth daily. Blood preasure    Historical Provider, MD   No Known Allergies  FAMILY HISTORY:  Family History  Problem Relation Age of Onset  . Heart disease Mother   . Diabetes Sister   . Heart disease Son    SOCIAL HISTORY:  reports that she has never smoked. She has never used smokeless tobacco. She reports that she does not drink alcohol or use illicit drugs.  REVIEW OF SYSTEMS:  Obtained from records as per HPI.    VITAL SIGNS: Temp:  [92.3 F (33.5 C)-93.9 F (34.4 C)] 92.3 F (33.5 C) (04/11 0915) Pulse Rate:  [51-72] 51 (04/11 0915) Resp:  [13-19] 14 (04/11 0915) BP: (127-212)/(59-98) 146/65 mmHg (04/11 0915) SpO2:  [100 %] 100 % (04/11 0752) FiO2 (%):  [40 %-100 %] 40 % (04/11 0844) Weight:  [180 lb (81.647 kg)-182 lb 15.7 oz (83 kg)] 182 lb 15.7 oz (83 kg) (04/11 0748) HEMODYNAMICS:   VENTILATOR SETTINGS: Vent Mode:  [-] PRVC FiO2 (%):   [40 %-100 %] 40 % Set Rate:  [14 bmp] 14 bmp Vt Set:  [550 mL] 550 mL PEEP:  [5 cmH20] 5 cmH20 Plateau Pressure:  [15 cmH20] 15 cmH20 INTAKE / OUTPUT: Intake/Output   None     PHYSICAL EXAMINATION: General:  wdwn female, critically ill Neuro:  Unresponsive, no corneals, weak gag/cough, L pupil blown, R pupil 28mm no response  HEENT:  Mm dry, c-collar, ETT Cardiovascular:  s1s2 bradycardic Lungs:  resps even non labored, coarse rhonchi  Abdomen:  Soft, hypoactive bs  Musculoskeletal:  Warm and dry, no edema   LABS:  CBC  Recent Labs Lab 2013/10/02 0755  WBC 14.2*  HGB 10.8*  HCT 34.2*  PLT 218   Coag's  Recent Labs Lab 2013-10-02 0755  INR 0.97   BMET  Recent Labs Lab 10-02-2013 0755  NA 144  K 3.2*  CL 103  CO2 24  BUN 13  CREATININE 0.71  GLUCOSE 184*   Electrolytes  Recent Labs Lab Oct 02, 2013 0755  CALCIUM 9.9   Sepsis Markers  Recent Labs Lab 2013/10/02 0822  LATICACIDVEN 2.66*   ABG  Recent Labs Lab 2013-10-02 0841  PHART 7.460*  PCO2ART 36.0  PO2ART 178.0*   Liver Enzymes  Recent Labs Lab 10-02-13 0755  AST 18  ALT 14  ALKPHOS 73  BILITOT 0.5  ALBUMIN 4.0   Cardiac Enzymes  Recent Labs Lab 02-Oct-2013 0755  TROPONINI <0.30  PROBNP 51.3   Glucose  Recent Labs Lab 02-Oct-2013 0755  GLUCAP 161*    Imaging Ct Head Wo Contrast  Oct 02, 2013   CLINICAL DATA:  Found unresponsive  EXAM: CT HEAD WITHOUT CONTRAST  CT CERVICAL SPINE WITHOUT CONTRAST  TECHNIQUE: Multidetector CT imaging of the head and cervical spine was performed following the standard protocol without intravenous contrast. Multiplanar CT image reconstructions of the cervical spine were also generated.  COMPARISON:  None.  FINDINGS: CT HEAD FINDINGS  There is a large (at least 7.4 x 4.1 cm) intraparenchymal hemorrhage centered primarily within the posterior aspect of the left parietal occipital lobe (image 17, series 2) which is associated with mass effect and near complete  effacement of the left lateral ventricle and approximately 1.5 cm of left to right midline shift and uncal herniation. This intraparenchymal hemorrhage is associated with an approximately 0.9 cm left-sided crescentic subdural hematoma.  There is an additional approximately 1.0 x 1.5 cm intraparenchymal hemorrhage within the subcortical aspect of the contralateral right frontal lobe (image 16, series 2). No definite intraventricular hemorrhage.  The patient is intubated and has a nasogastric tube. There are nonspecific air-fluid levels within the right maxillary sinus and the sphenoid sinus. Minimal polypoid mucosal thickening within the bilateral maxillary sinuses. There is minimal mucosal thickening within the bilateral posterior ethmoidal air  cells. Normal appearance of the bilateral frontal sinuses and mastoid air cells.  Regional soft tissues are normal. No displaced calvarial fracture. No radiopaque foreign body.  CT CERVICAL SPINE FINDINGS  C1 to the superior endplate of T1 is imaged.  Normal alignment of the cervical spine. No anterolisthesis or retrolisthesis. The dens is normally positioned between the lateral masses of C1. Normal atlantodental and atlantoaxial articulations. The bilateral facets are normally aligned.  No fracture or static subluxation of the cervical spine. Cervical vertebral body heights are preserved. Prevertebral soft tissues are normal.  There is mild-to-moderate multilevel cervical spine DDD, likely worse at C3-C4 and C6-C7 with disc space height loss, endplate irregularity and small posteriorly directed disc osteophyte complexes at these locations. Scattered shotty bilateral cervical lymph nodes individually not enlarged by size criteria on this noncontrast examination. There is heterogeneous appearance of the thyroid gland without discrete nodule on this noncontrast evaluation.  Interval development of a consolidative airspace opacity within the imaged left lung apex, not seen on  chest radiograph performed earlier same day.  IMPRESSION: 1. Large (at least 7.4 cm) intraparenchymal hemorrhage centered primarily within the left posterior parietal occipital lobe with associated small to moderate-sized left-sided subdural hematoma and mass effect with approximately 1.5 cm of left-to-right midline shift and uncal herniation. The dominant hemorrhage within the posterior parietal lobe is associated with a smaller (approximately 1.5 cm) intraparenchymal hemorrhage within the subcortical aspect of the contralateral right frontal lobe - constellation of findings are suggestive of recent fall with contrecoup type injury. Clinical correlation is advised. No displaced calvarial fracture. 2. No fracture or static subluxation of the cervical spine. 3. Interval development of a consolidative airspace opacity within the imaged left lung apex, not seen on chest radiograph performed earlier same day, suggestive of aspiration.  Critical Value/emergent results were called by telephone at the time of interpretation on 09/16/2013 at 8:33 AM to Dr. Serita Grit , who verbally acknowledged these results.   Electronically Signed   By: Sandi Mariscal M.D.   On: 10/07/2013 08:46   Ct Cervical Spine Wo Contrast  10/01/2013   CLINICAL DATA:  Found unresponsive  EXAM: CT HEAD WITHOUT CONTRAST  CT CERVICAL SPINE WITHOUT CONTRAST  TECHNIQUE: Multidetector CT imaging of the head and cervical spine was performed following the standard protocol without intravenous contrast. Multiplanar CT image reconstructions of the cervical spine were also generated.  COMPARISON:  None.  FINDINGS: CT HEAD FINDINGS  There is a large (at least 7.4 x 4.1 cm) intraparenchymal hemorrhage centered primarily within the posterior aspect of the left parietal occipital lobe (image 17, series 2) which is associated with mass effect and near complete effacement of the left lateral ventricle and approximately 1.5 cm of left to right midline shift and uncal  herniation. This intraparenchymal hemorrhage is associated with an approximately 0.9 cm left-sided crescentic subdural hematoma.  There is an additional approximately 1.0 x 1.5 cm intraparenchymal hemorrhage within the subcortical aspect of the contralateral right frontal lobe (image 16, series 2). No definite intraventricular hemorrhage.  The patient is intubated and has a nasogastric tube. There are nonspecific air-fluid levels within the right maxillary sinus and the sphenoid sinus. Minimal polypoid mucosal thickening within the bilateral maxillary sinuses. There is minimal mucosal thickening within the bilateral posterior ethmoidal air cells. Normal appearance of the bilateral frontal sinuses and mastoid air cells.  Regional soft tissues are normal. No displaced calvarial fracture. No radiopaque foreign body.  CT CERVICAL SPINE FINDINGS  C1 to the  superior endplate of T1 is imaged.  Normal alignment of the cervical spine. No anterolisthesis or retrolisthesis. The dens is normally positioned between the lateral masses of C1. Normal atlantodental and atlantoaxial articulations. The bilateral facets are normally aligned.  No fracture or static subluxation of the cervical spine. Cervical vertebral body heights are preserved. Prevertebral soft tissues are normal.  There is mild-to-moderate multilevel cervical spine DDD, likely worse at C3-C4 and C6-C7 with disc space height loss, endplate irregularity and small posteriorly directed disc osteophyte complexes at these locations. Scattered shotty bilateral cervical lymph nodes individually not enlarged by size criteria on this noncontrast examination. There is heterogeneous appearance of the thyroid gland without discrete nodule on this noncontrast evaluation.  Interval development of a consolidative airspace opacity within the imaged left lung apex, not seen on chest radiograph performed earlier same day.  IMPRESSION: 1. Large (at least 7.4 cm) intraparenchymal  hemorrhage centered primarily within the left posterior parietal occipital lobe with associated small to moderate-sized left-sided subdural hematoma and mass effect with approximately 1.5 cm of left-to-right midline shift and uncal herniation. The dominant hemorrhage within the posterior parietal lobe is associated with a smaller (approximately 1.5 cm) intraparenchymal hemorrhage within the subcortical aspect of the contralateral right frontal lobe - constellation of findings are suggestive of recent fall with contrecoup type injury. Clinical correlation is advised. No displaced calvarial fracture. 2. No fracture or static subluxation of the cervical spine. 3. Interval development of a consolidative airspace opacity within the imaged left lung apex, not seen on chest radiograph performed earlier same day, suggestive of aspiration.  Critical Value/emergent results were called by telephone at the time of interpretation on 09/09/2013 at 8:33 AM to Dr. Serita Grit , who verbally acknowledged these results.   Electronically Signed   By: Sandi Mariscal M.D.   On: 09/25/2013 08:46   Dg Chest Portable 1 View  09/25/2013   CLINICAL DATA:  Evaluate endotracheal tube.  EXAM: PORTABLE CHEST - 1 VIEW  COMPARISON:  10/30/2011  FINDINGS: Endotracheal tube tip lies 3.2 cm above the carina, well positioned.  Lungs clear. Cardiac silhouette is mildly enlarged. Aorta is mildly uncoiled. No mediastinal or hilar masses. No pleural effusion or pneumothorax.  IMPRESSION: 1. Endotracheal tube well positioned with its tip 3.2 cm above the carina. 2. No acute cardiopulmonary disease.   Electronically Signed   By: Lajean Manes M.D.   On: 09/09/2013 08:14      ASSESSMENT / PLAN:  NEUROLOGIC ICH - primary pathology from L posterior cerebral artery infarct with secondary hemorrhage per nsgy's interpretation  SDH  P:   Neuro to see  No surgical intervention per nsgy  Poor prognosis  BP management    PULMONARY Acute respiratory  failure - r/t neuro event as above  ?aspiration  P:   F/u CXR  F/u ABG  abx as above   CARDIOVASCULAR HTN P:  BP management - improved on propofol for now   RENAL Hypokalemia  P:   K replacement as needed  F/u chem   GASTROINTESTINAL No active issue  P:   Initiate TF in am  PPI   HEMATOLOGIC Head bleed as above - not coagulopathic  P:  SCD's  F/u chem   INFECTIOUS ?aspiration  Leukocytosis - mild  P:   Empiric unasyn   ENDOCRINE Hyperglycemia - mild  P:   SSI     Marijean Heath, NP 09/25/2013  9:56 AM Pager: [phone] or [phone]   Summary -  catastrophic PCA stroke with associated bleed with midline shift & left blown pupil s/o uncal herniation, dismal prognosis discussed with daughter, who wants to try all possible interventions.  I have personally obtained a history, examined the patient, evaluated laboratory and imaging results, formulated the assessment and plan and placed orders. CRITICAL CARE: The patient is critically ill with multiple organ systems failure and requires high complexity decision making for assessment and support, frequent evaluation and titration of therapies, application of advanced monitoring technologies and extensive interpretation of multiple databases. Critical Care Time devoted to patient care services described in this note is  50 minutes.   Kara Mead MD. Shade Flood. [facility] Pager [phone] If no response call [phone]

## 2013-09-18 NOTE — Consult Note (Signed)
Referring Physician: Elsworth Soho    Chief Complaint: Andre Lefort  HPI: Andrea Fox is an 80 y.o. female who lives alone.  Patient is unable to provide any history.  Went out to dinner with her family last evening and was doing fine.  It seems that she was able to get up this morning and get dressed.  Was found on the ground outside by a passerby.  EMS was called.  Patient remained unresponsive on presentation.  Was intubated in the ED and started on Propofol.    Date last known well: Date: 09/17/2013 Time last known well: Time: 21:00 tPA Given: No: ICH  Past Medical History  Diagnosis Date  . Hypertension   . Hyperlipemia   . Disorder of bone and cartilage, unspecified   . Arthritis   . Anxiety   . Anemia   . Lung mass 10/2011  . Right bundle branch block and left posterior fascicular block   . Other abnormal blood chemistry   . Unspecified vitamin D deficiency   . Osteoarthrosis, unspecified whether generalized or localized, lower leg   . Screening for iron deficiency anemia   . Localized superficial swelling, mass, or lump   . Screening for diabetes mellitus   . Essential hypertension, benign   . Routine gynecological examination 07/23/2011  . Special screening for malignant neoplasms, colon     Past Surgical History  Procedure Laterality Date  . Appendectomy  1953  . Back surgery  1975  . Knee surgery  1993  . Tumor in neck  2010    Dr Stephani Police  . Excision lung mass  10/22/2011  . Mass excision  10/22/2011    Procedure: EXCISION MASS;  Surgeon: Nicanor Alcon, MD;  Location: [facility] OR;  Service: Thoracic;  Laterality: Left;  EXCISION LEFT CHEST MASS    Family History  Problem Relation Age of Onset  . Heart disease Mother   . Diabetes Sister   . Heart disease Son    Social History:  reports that she has never smoked. She has never used smokeless tobacco. She reports that she does not drink alcohol or use illicit drugs.  Allergies: No Known Allergies  Medications:   I have reviewed the patient's current medications. Prior to Admission:  Prescriptions prior to admission  Medication Sig Dispense Refill  . aspirin 81 MG tablet Take 81 mg by mouth daily.      . Cholecalciferol (VITAMIN D-3) 5000 UNITS TABS Take 1 tablet by mouth daily.      . ferrous sulfate 325 (65 FE) MG tablet Take 1 tablet (325 mg total) by mouth daily with breakfast.  30 tablet  3  . hydrALAZINE (APRESOLINE) 50 MG tablet Take one tablet  In am and half a tablet in pm  180 tablet  3  . losartan-hydrochlorothiazide (HYZAAR) 100-25 MG per tablet Take 1 tablet by mouth daily. Take one tablet once a day for blood pressure  90 tablet  3  . lovastatin (MEVACOR) 20 MG tablet Take one tablet once a day for cholesterol  90 tablet  3  . metoprolol succinate (TOPROL-XL) 50 MG 24 hr tablet Take 50 mg by mouth daily. Blood preasure       Scheduled: . ampicillin-sulbactam (UNASYN) IV  1.5 g Intravenous Q8H  . pantoprazole (PROTONIX) IV  40 mg Intravenous QHS  . potassium chloride  10 mEq Intravenous Q1 Hr x 4  . propofol        ROS: History obtained from daughter  General  ROS: negative for - chills, fatigue, fever, night sweats, weight gain or weight loss Psychological ROS: negative for - behavioral disorder, hallucinations, memory difficulties, mood swings or suicidal ideation Ophthalmic ROS: negative for - blurry vision, double vision, eye pain or loss of vision ENT ROS: negative for - epistaxis, nasal discharge, oral lesions, sore throat, tinnitus or vertigo Allergy and Immunology ROS: negative for - hives or itchy/watery eyes Hematological and Lymphatic ROS: negative for - bleeding problems, bruising or swollen lymph nodes Endocrine ROS: negative for - galactorrhea, hair pattern changes, polydipsia/polyuria or temperature intolerance Respiratory ROS: negative for - cough, hemoptysis, shortness of breath or wheezing Cardiovascular ROS: negative for - chest pain, dyspnea on exertion, edema  or irregular heartbeat Gastrointestinal ROS: negative for - abdominal pain, diarrhea, hematemesis, nausea/vomiting or stool incontinence Genito-Urinary ROS: negative for - dysuria, hematuria, incontinence or urinary frequency/urgency Musculoskeletal ROS: knee pain Neurological ROS: as noted in HPI Dermatological ROS: negative for rash and skin lesion changes  Physical Examination: Blood pressure 141/55, pulse 51, temperature 92.8 F (33.8 C), resp. rate 16, height 5\' 10"  (1.778 m), weight 83 kg (182 lb 15.7 oz), SpO2 100.00%.  Neurologic Examination: Mental Status: Patient does not respond to verbal stimuli.  With deep sternal rub has extension of the lower extremities.  Does not follow commands.  No verbalizations noted.  Cranial Nerves: II: patient does not respond confrontation bilaterally, pupils right 3 mm, left 6 mm,and unreactive bilaterally III,IV,VI: doll's response absent bilaterally.  V,VII: corneal reflex absent bilaterally  VIII: patient does not respond to verbal stimuli IX,X: gag reflex absent, XI: trapezius strength unable to test bilaterally XII: tongue strength unable to test Motor: Extremities flaccid throughout.  No spontaneous movement noted.  No purposeful movements noted.  Does not localize to pain. Sensory: Does not respond to noxious stimuli in any extremity. Deep Tendon Reflexes:  Absent throughout. Plantars: upgoing on the left.  Mute on the right Cerebellar: Unable to perform   Laboratory Studies:  Basic Metabolic Panel:  Recent Labs Lab 09-19-13 0755  NA 144  K 3.2*  CL 103  CO2 24  GLUCOSE 184*  BUN 13  CREATININE 0.71  CALCIUM 9.9    Liver Function Tests:  Recent Labs Lab 19-Sep-2013 0755  AST 18  ALT 14  ALKPHOS 73  BILITOT 0.5  PROT 7.3  ALBUMIN 4.0   No results found for this basename: LIPASE, AMYLASE,  in the last 168 hours No results found for this basename: AMMONIA,  in the last 168 hours  CBC:  Recent Labs Lab  September 19, 2013 0755  WBC 14.2*  NEUTROABS 9.6*  HGB 10.8*  HCT 34.2*  MCV 75.8*  PLT 218    Cardiac Enzymes:  Recent Labs Lab 2013-09-19 0755  TROPONINI <0.30    BNP: No components found with this basename: POCBNP,   CBG:  Recent Labs Lab 09-19-2013 0755  GLUCAP 161*    Microbiology: Results for orders placed during the hospital encounter of 10/18/11  SURGICAL PCR SCREEN     Status: None   Collection Time    10/18/11 10:21 AM      Result Value Ref Range Status   MRSA, PCR NEGATIVE  NEGATIVE Final   Staphylococcus aureus NEGATIVE  NEGATIVE Final   Comment:            The Xpert SA Assay (FDA     approved for NASAL specimens     only), is one component of     a comprehensive surveillance  program.  It is not intended     to diagnose infection nor to     guide or monitor treatment.    Coagulation Studies:  Recent Labs  09/25/2013 0755  LABPROT 12.7  INR 0.97    Urinalysis:  Recent Labs Lab 09/22/2013 0757  COLORURINE AMBER*  LABSPEC 1.030  PHURINE 5.5  GLUCOSEU NEGATIVE  HGBUR NEGATIVE  BILIRUBINUR NEGATIVE  KETONESUR NEGATIVE  PROTEINUR >300*  UROBILINOGEN 0.2  NITRITE NEGATIVE  LEUKOCYTESUR NEGATIVE    Lipid Panel:    Component Value Date/Time   TRIG 91 07/28/2013 1514   HDL 55 07/28/2013 1514   CHOLHDL 3.1 07/28/2013 1514   LDLCALC 97 07/28/2013 1514    HgbA1C:  No results found for this basename: HGBA1C    Urine Drug Screen:   No results found for this basename: labopia, cocainscrnur, labbenz, amphetmu, thcu, labbarb    Alcohol Level: No results found for this basename: ETH,  in the last 168 hours  Other results: EKG: sinus rhythm at 59 bpm.  Imaging: Ct Head Wo Contrast  09/22/2013   CLINICAL DATA:  Found unresponsive  EXAM: CT HEAD WITHOUT CONTRAST  CT CERVICAL SPINE WITHOUT CONTRAST  TECHNIQUE: Multidetector CT imaging of the head and cervical spine was performed following the standard protocol without intravenous contrast.  Multiplanar CT image reconstructions of the cervical spine were also generated.  COMPARISON:  None.  FINDINGS: CT HEAD FINDINGS  There is a large (at least 7.4 x 4.1 cm) intraparenchymal hemorrhage centered primarily within the posterior aspect of the left parietal occipital lobe (image 17, series 2) which is associated with mass effect and near complete effacement of the left lateral ventricle and approximately 1.5 cm of left to right midline shift and uncal herniation. This intraparenchymal hemorrhage is associated with an approximately 0.9 cm left-sided crescentic subdural hematoma.  There is an additional approximately 1.0 x 1.5 cm intraparenchymal hemorrhage within the subcortical aspect of the contralateral right frontal lobe (image 16, series 2). No definite intraventricular hemorrhage.  The patient is intubated and has a nasogastric tube. There are nonspecific air-fluid levels within the right maxillary sinus and the sphenoid sinus. Minimal polypoid mucosal thickening within the bilateral maxillary sinuses. There is minimal mucosal thickening within the bilateral posterior ethmoidal air cells. Normal appearance of the bilateral frontal sinuses and mastoid air cells.  Regional soft tissues are normal. No displaced calvarial fracture. No radiopaque foreign body.  CT CERVICAL SPINE FINDINGS  C1 to the superior endplate of T1 is imaged.  Normal alignment of the cervical spine. No anterolisthesis or retrolisthesis. The dens is normally positioned between the lateral masses of C1. Normal atlantodental and atlantoaxial articulations. The bilateral facets are normally aligned.  No fracture or static subluxation of the cervical spine. Cervical vertebral body heights are preserved. Prevertebral soft tissues are normal.  There is mild-to-moderate multilevel cervical spine DDD, likely worse at C3-C4 and C6-C7 with disc space height loss, endplate irregularity and small posteriorly directed disc osteophyte complexes at  these locations. Scattered shotty bilateral cervical lymph nodes individually not enlarged by size criteria on this noncontrast examination. There is heterogeneous appearance of the thyroid gland without discrete nodule on this noncontrast evaluation.  Interval development of a consolidative airspace opacity within the imaged left lung apex, not seen on chest radiograph performed earlier same day.  IMPRESSION: 1. Large (at least 7.4 cm) intraparenchymal hemorrhage centered primarily within the left posterior parietal occipital lobe with associated small to moderate-sized left-sided subdural hematoma and mass  effect with approximately 1.5 cm of left-to-right midline shift and uncal herniation. The dominant hemorrhage within the posterior parietal lobe is associated with a smaller (approximately 1.5 cm) intraparenchymal hemorrhage within the subcortical aspect of the contralateral right frontal lobe - constellation of findings are suggestive of recent fall with contrecoup type injury. Clinical correlation is advised. No displaced calvarial fracture. 2. No fracture or static subluxation of the cervical spine. 3. Interval development of a consolidative airspace opacity within the imaged left lung apex, not seen on chest radiograph performed earlier same day, suggestive of aspiration.  Critical Value/emergent results were called by telephone at the time of interpretation on 09/29/2013 at 8:33 AM to Dr. Serita Grit , who verbally acknowledged these results.   Electronically Signed   By: Sandi Mariscal M.D.   On: 09/17/2013 08:46   Ct Cervical Spine Wo Contrast  09/20/2013   CLINICAL DATA:  Found unresponsive  EXAM: CT HEAD WITHOUT CONTRAST  CT CERVICAL SPINE WITHOUT CONTRAST  TECHNIQUE: Multidetector CT imaging of the head and cervical spine was performed following the standard protocol without intravenous contrast. Multiplanar CT image reconstructions of the cervical spine were also generated.  COMPARISON:  None.   FINDINGS: CT HEAD FINDINGS  There is a large (at least 7.4 x 4.1 cm) intraparenchymal hemorrhage centered primarily within the posterior aspect of the left parietal occipital lobe (image 17, series 2) which is associated with mass effect and near complete effacement of the left lateral ventricle and approximately 1.5 cm of left to right midline shift and uncal herniation. This intraparenchymal hemorrhage is associated with an approximately 0.9 cm left-sided crescentic subdural hematoma.  There is an additional approximately 1.0 x 1.5 cm intraparenchymal hemorrhage within the subcortical aspect of the contralateral right frontal lobe (image 16, series 2). No definite intraventricular hemorrhage.  The patient is intubated and has a nasogastric tube. There are nonspecific air-fluid levels within the right maxillary sinus and the sphenoid sinus. Minimal polypoid mucosal thickening within the bilateral maxillary sinuses. There is minimal mucosal thickening within the bilateral posterior ethmoidal air cells. Normal appearance of the bilateral frontal sinuses and mastoid air cells.  Regional soft tissues are normal. No displaced calvarial fracture. No radiopaque foreign body.  CT CERVICAL SPINE FINDINGS  C1 to the superior endplate of T1 is imaged.  Normal alignment of the cervical spine. No anterolisthesis or retrolisthesis. The dens is normally positioned between the lateral masses of C1. Normal atlantodental and atlantoaxial articulations. The bilateral facets are normally aligned.  No fracture or static subluxation of the cervical spine. Cervical vertebral body heights are preserved. Prevertebral soft tissues are normal.  There is mild-to-moderate multilevel cervical spine DDD, likely worse at C3-C4 and C6-C7 with disc space height loss, endplate irregularity and small posteriorly directed disc osteophyte complexes at these locations. Scattered shotty bilateral cervical lymph nodes individually not enlarged by size  criteria on this noncontrast examination. There is heterogeneous appearance of the thyroid gland without discrete nodule on this noncontrast evaluation.  Interval development of a consolidative airspace opacity within the imaged left lung apex, not seen on chest radiograph performed earlier same day.  IMPRESSION: 1. Large (at least 7.4 cm) intraparenchymal hemorrhage centered primarily within the left posterior parietal occipital lobe with associated small to moderate-sized left-sided subdural hematoma and mass effect with approximately 1.5 cm of left-to-right midline shift and uncal herniation. The dominant hemorrhage within the posterior parietal lobe is associated with a smaller (approximately 1.5 cm) intraparenchymal hemorrhage within the subcortical aspect of  the contralateral right frontal lobe - constellation of findings are suggestive of recent fall with contrecoup type injury. Clinical correlation is advised. No displaced calvarial fracture. 2. No fracture or static subluxation of the cervical spine. 3. Interval development of a consolidative airspace opacity within the imaged left lung apex, not seen on chest radiograph performed earlier same day, suggestive of aspiration.  Critical Value/emergent results were called by telephone at the time of interpretation on 10/05/2013 at 8:33 AM to Dr. Serita Grit , who verbally acknowledged these results.   Electronically Signed   By: Sandi Mariscal M.D.   On: 10/04/2013 08:46   Dg Chest Portable 1 View  09/30/2013   CLINICAL DATA:  Evaluate endotracheal tube.  EXAM: PORTABLE CHEST - 1 VIEW  COMPARISON:  10/30/2011  FINDINGS: Endotracheal tube tip lies 3.2 cm above the carina, well positioned.  Lungs clear. Cardiac silhouette is mildly enlarged. Aorta is mildly uncoiled. No mediastinal or hilar masses. No pleural effusion or pneumothorax.  IMPRESSION: 1. Endotracheal tube well positioned with its tip 3.2 cm above the carina. 2. No acute cardiopulmonary disease.    Electronically Signed   By: Lajean Manes M.D.   On: 09/21/2013 08:14    Assessment: 80 y.o. female found unresponsive.  Head CT reviewed and shows what is likely a left PCA infarct with associated hemorrhage, left posterior parietal occipital lobe SDH and small right frontal lobe intraparenchymal hemorrhage.  1.5 cm midline shift noted with evidence of uncal herniation.  Neurosurgery has evaluated the patient and does not feel that surgical intervention is indicated.  Consult called for further recommendations. Patient is on ASA at home.   Patient already with signs of impending herniation on examination.  Severity of patient's injury discussed with daughter and granddaughter.  They do not wish for the patient to be DNR and want all possible interventions pursued.  Current neurological examination does suggest a likely poor outcome and they are aware.    Stroke Risk Factors - hyperlipidemia and hypertension  Plan: 1. HgbA1c, fasting lipid panel 2. Follow up head CT in 24 hours 3. PT consult, OT consult, Speech consult 4. Echocardiogram 5. Carotid dopplers 6. Prophylactic therapy-None 7. Risk factor modification 8. Telemetry monitoring 9. Frequent neuro checks 10. Hypertonic saline  This patient is critically ill and at significant risk of neurological worsening, death and care requires constant monitoring of vital signs, hemodynamics,respiratory and cardiac monitoring, neurological assessment, discussion with family, other specialists and medical decision making of high complexity. I spent 85 minutes of neurocritical care time  in the care of  this patient.   Alexis Goodell, MD [facility] [phone] 09/25/2013, 11:10 AM

## 2013-09-18 NOTE — ED Notes (Signed)
Neurosurgery MD at bedside.

## 2013-09-18 NOTE — Progress Notes (Signed)
Pt transported on vent to CT and brought back to ED room. FIO2 weaned per ABG results.

## 2013-09-18 NOTE — Consult Note (Signed)
Reason for Consult: Acute left hemispheric hemorrhagic infarction Referring Physician: Dr. Pennie Banter is an 80 y.o. female.  HPI: Patient is a 80 year old female who was previously healthy. She was found unconscious outside of her apartment today. No history of trauma. Patient has a known history of hypercholesterolemia and hypertension. Patient takes no blood thinners. The patient was noted to have very labored and regular respirations. She was unconscious upon arrival to the emergency room. She extended to pain. Head CT scan taken in the emergency room demonstrates a large posterior cerebral artery infarction extending into her left diencephalon and upper brainstem. There is some secondary areas of hemorrhage but the primary pathology is that of a large ischemic infarction area and the patient has no significant mass effect from this hematoma at present.  Past Medical History  Diagnosis Date  . Hypertension   . Hyperlipemia   . Disorder of bone and cartilage, unspecified   . Arthritis   . Anxiety   . Anemia   . Lung mass 10/2011  . Right bundle branch block and left posterior fascicular block   . Other abnormal blood chemistry   . Unspecified vitamin D deficiency   . Osteoarthrosis, unspecified whether generalized or localized, lower leg   . Screening for iron deficiency anemia   . Localized superficial swelling, mass, or lump   . Screening for diabetes mellitus   . Essential hypertension, benign   . Routine gynecological examination 07/23/2011  . Special screening for malignant neoplasms, colon     Past Surgical History  Procedure Laterality Date  . Appendectomy  1953  . Back surgery  1975  . Knee surgery  1993  . Tumor in neck  2010    Dr Stephani Police  . Excision lung mass  10/22/2011  . Mass excision  10/22/2011    Procedure: EXCISION MASS;  Surgeon: Nicanor Alcon, MD;  Location: [facility] OR;  Service: Thoracic;  Laterality: Left;  EXCISION LEFT CHEST MASS     Family History  Problem Relation Age of Onset  . Heart disease Mother   . Diabetes Sister   . Heart disease Son     Social History:  reports that she has never smoked. She has never used smokeless tobacco. She reports that she does not drink alcohol or use illicit drugs.  Allergies: No Known Allergies  Medications: I have reviewed the patient's current medications.  Results for orders placed during the hospital encounter of 10/01/2013 (from the past 48 hour(s))  CBG MONITORING, ED     Status: Abnormal   Collection Time    09/25/2013  7:55 AM      Result Value Ref Range   Glucose-Capillary 161 (*) 70 - 99 mg/dL  COMPREHENSIVE METABOLIC PANEL     Status: Abnormal   Collection Time    10/01/2013  7:55 AM      Result Value Ref Range   Sodium 144  137 - 147 mEq/L   Potassium 3.2 (*) 3.7 - 5.3 mEq/L   Chloride 103  96 - 112 mEq/L   CO2 24  19 - 32 mEq/L   Glucose, Bld 184 (*) 70 - 99 mg/dL   BUN 13  6 - 23 mg/dL   Creatinine, Ser 0.71  0.50 - 1.10 mg/dL   Calcium 9.9  8.4 - 10.5 mg/dL   Total Protein 7.3  6.0 - 8.3 g/dL   Albumin 4.0  3.5 - 5.2 g/dL   AST 18  0 - 37 U/L  ALT 14  0 - 35 U/L   Alkaline Phosphatase 73  39 - 117 U/L   Total Bilirubin 0.5  0.3 - 1.2 mg/dL   GFR calc non Af Amer 80 (*) >90 mL/min   GFR calc Af Amer >90  >90 mL/min   Comment: (NOTE)     The eGFR has been calculated using the CKD EPI equation.     This calculation has not been validated in all clinical situations.     eGFR's persistently <90 mL/min signify possible Chronic Kidney     Disease.  PROTIME-INR     Status: None   Collection Time    09/20/2013  7:55 AM      Result Value Ref Range   Prothrombin Time 12.7  11.6 - 15.2 seconds   INR 0.97  0.00 - 1.49  CBC WITH DIFFERENTIAL     Status: Abnormal   Collection Time    09/15/2013  7:55 AM      Result Value Ref Range   WBC 14.2 (*) 4.0 - 10.5 K/uL   RBC 4.51  3.87 - 5.11 MIL/uL   Hemoglobin 10.8 (*) 12.0 - 15.0 g/dL   HCT 34.2 (*) 36.0 - 46.0  %   MCV 75.8 (*) 78.0 - 100.0 fL   MCH 23.9 (*) 26.0 - 34.0 pg   MCHC 31.6  30.0 - 36.0 g/dL   RDW 15.6 (*) 11.5 - 15.5 %   Platelets 218  150 - 400 K/uL   Neutrophils Relative % 68  43 - 77 %   Neutro Abs 9.6 (*) 1.7 - 7.7 K/uL   Lymphocytes Relative 28  12 - 46 %   Lymphs Abs 4.0  0.7 - 4.0 K/uL   Monocytes Relative 4  3 - 12 %   Monocytes Absolute 0.5  0.1 - 1.0 K/uL   Eosinophils Relative 0  0 - 5 %   Eosinophils Absolute 0.1  0.0 - 0.7 K/uL   Basophils Relative 0  0 - 1 %   Basophils Absolute 0.0  0.0 - 0.1 K/uL  TROPONIN I     Status: None   Collection Time    10/02/2013  7:55 AM      Result Value Ref Range   Troponin I <0.30  <0.30 ng/mL   Comment:            Due to the release kinetics of cTnI,     a negative result within the first hours     of the onset of symptoms does not rule out     myocardial infarction with certainty.     If myocardial infarction is still suspected,     repeat the test at appropriate intervals.  PRO B NATRIURETIC PEPTIDE     Status: None   Collection Time    09/11/2013  7:55 AM      Result Value Ref Range   Pro B Natriuretic peptide (BNP) 51.3  0 - 450 pg/mL  URINALYSIS, ROUTINE W REFLEX MICROSCOPIC     Status: Abnormal   Collection Time    09/12/2013  7:57 AM      Result Value Ref Range   Color, Urine AMBER (*) YELLOW   Comment: BIOCHEMICALS MAY BE AFFECTED BY COLOR   APPearance CLOUDY (*) CLEAR   Specific Gravity, Urine 1.030  1.005 - 1.030   pH 5.5  5.0 - 8.0   Glucose, UA NEGATIVE  NEGATIVE mg/dL   Hgb urine dipstick NEGATIVE  NEGATIVE  Bilirubin Urine NEGATIVE  NEGATIVE   Ketones, ur NEGATIVE  NEGATIVE mg/dL   Protein, ur >300 (*) NEGATIVE mg/dL   Urobilinogen, UA 0.2  0.0 - 1.0 mg/dL   Nitrite NEGATIVE  NEGATIVE   Leukocytes, UA NEGATIVE  NEGATIVE  URINE MICROSCOPIC-ADD ON     Status: Abnormal   Collection Time    09/11/2013  7:57 AM      Result Value Ref Range   Squamous Epithelial / LPF FEW (*) RARE   Urine-Other MUCOUS PRESENT      Comment: AMORPHOUS URATES/PHOSPHATES  I-STAT CG4 LACTIC ACID, ED     Status: Abnormal   Collection Time    09/29/2013  8:22 AM      Result Value Ref Range   Lactic Acid, Venous 2.66 (*) 0.5 - 2.2 mmol/L  I-STAT ARTERIAL BLOOD GAS, ED     Status: Abnormal   Collection Time    09/28/2013  8:41 AM      Result Value Ref Range   pH, Arterial 7.460 (*) 7.350 - 7.450   pCO2 arterial 36.0  35.0 - 45.0 mmHg   pO2, Arterial 178.0 (*) 80.0 - 100.0 mmHg   Bicarbonate 26.2 (*) 20.0 - 24.0 mEq/L   TCO2 27  0 - 100 mmol/L   O2 Saturation 100.0     Acid-Base Excess 2.0  0.0 - 2.0 mmol/L   Patient temperature 93.9 F     Collection site RADIAL, ALLEN'S TEST ACCEPTABLE     Drawn by Operator     Sample type ARTERIAL      Ct Head Wo Contrast  09/13/2013   CLINICAL DATA:  Found unresponsive  EXAM: CT HEAD WITHOUT CONTRAST  CT CERVICAL SPINE WITHOUT CONTRAST  TECHNIQUE: Multidetector CT imaging of the head and cervical spine was performed following the standard protocol without intravenous contrast. Multiplanar CT image reconstructions of the cervical spine were also generated.  COMPARISON:  None.  FINDINGS: CT HEAD FINDINGS  There is a large (at least 7.4 x 4.1 cm) intraparenchymal hemorrhage centered primarily within the posterior aspect of the left parietal occipital lobe (image 17, series 2) which is associated with mass effect and near complete effacement of the left lateral ventricle and approximately 1.5 cm of left to right midline shift and uncal herniation. This intraparenchymal hemorrhage is associated with an approximately 0.9 cm left-sided crescentic subdural hematoma.  There is an additional approximately 1.0 x 1.5 cm intraparenchymal hemorrhage within the subcortical aspect of the contralateral right frontal lobe (image 16, series 2). No definite intraventricular hemorrhage.  The patient is intubated and has a nasogastric tube. There are nonspecific air-fluid levels within the right maxillary sinus  and the sphenoid sinus. Minimal polypoid mucosal thickening within the bilateral maxillary sinuses. There is minimal mucosal thickening within the bilateral posterior ethmoidal air cells. Normal appearance of the bilateral frontal sinuses and mastoid air cells.  Regional soft tissues are normal. No displaced calvarial fracture. No radiopaque foreign body.  CT CERVICAL SPINE FINDINGS  C1 to the superior endplate of T1 is imaged.  Normal alignment of the cervical spine. No anterolisthesis or retrolisthesis. The dens is normally positioned between the lateral masses of C1. Normal atlantodental and atlantoaxial articulations. The bilateral facets are normally aligned.  No fracture or static subluxation of the cervical spine. Cervical vertebral body heights are preserved. Prevertebral soft tissues are normal.  There is mild-to-moderate multilevel cervical spine DDD, likely worse at C3-C4 and C6-C7 with disc space height loss, endplate irregularity and small posteriorly  directed disc osteophyte complexes at these locations. Scattered shotty bilateral cervical lymph nodes individually not enlarged by size criteria on this noncontrast examination. There is heterogeneous appearance of the thyroid gland without discrete nodule on this noncontrast evaluation.  Interval development of a consolidative airspace opacity within the imaged left lung apex, not seen on chest radiograph performed earlier same day.  IMPRESSION: 1. Large (at least 7.4 cm) intraparenchymal hemorrhage centered primarily within the left posterior parietal occipital lobe with associated small to moderate-sized left-sided subdural hematoma and mass effect with approximately 1.5 cm of left-to-right midline shift and uncal herniation. The dominant hemorrhage within the posterior parietal lobe is associated with a smaller (approximately 1.5 cm) intraparenchymal hemorrhage within the subcortical aspect of the contralateral right frontal lobe - constellation of  findings are suggestive of recent fall with contrecoup type injury. Clinical correlation is advised. No displaced calvarial fracture. 2. No fracture or static subluxation of the cervical spine. 3. Interval development of a consolidative airspace opacity within the imaged left lung apex, not seen on chest radiograph performed earlier same day, suggestive of aspiration.  Critical Value/emergent results were called by telephone at the time of interpretation on 09/10/2013 at 8:33 AM to Dr. Serita Grit , who verbally acknowledged these results.   Electronically Signed   By: Sandi Mariscal M.D.   On: 10/01/2013 08:46   Ct Cervical Spine Wo Contrast  09/10/2013   CLINICAL DATA:  Found unresponsive  EXAM: CT HEAD WITHOUT CONTRAST  CT CERVICAL SPINE WITHOUT CONTRAST  TECHNIQUE: Multidetector CT imaging of the head and cervical spine was performed following the standard protocol without intravenous contrast. Multiplanar CT image reconstructions of the cervical spine were also generated.  COMPARISON:  None.  FINDINGS: CT HEAD FINDINGS  There is a large (at least 7.4 x 4.1 cm) intraparenchymal hemorrhage centered primarily within the posterior aspect of the left parietal occipital lobe (image 17, series 2) which is associated with mass effect and near complete effacement of the left lateral ventricle and approximately 1.5 cm of left to right midline shift and uncal herniation. This intraparenchymal hemorrhage is associated with an approximately 0.9 cm left-sided crescentic subdural hematoma.  There is an additional approximately 1.0 x 1.5 cm intraparenchymal hemorrhage within the subcortical aspect of the contralateral right frontal lobe (image 16, series 2). No definite intraventricular hemorrhage.  The patient is intubated and has a nasogastric tube. There are nonspecific air-fluid levels within the right maxillary sinus and the sphenoid sinus. Minimal polypoid mucosal thickening within the bilateral maxillary sinuses. There  is minimal mucosal thickening within the bilateral posterior ethmoidal air cells. Normal appearance of the bilateral frontal sinuses and mastoid air cells.  Regional soft tissues are normal. No displaced calvarial fracture. No radiopaque foreign body.  CT CERVICAL SPINE FINDINGS  C1 to the superior endplate of T1 is imaged.  Normal alignment of the cervical spine. No anterolisthesis or retrolisthesis. The dens is normally positioned between the lateral masses of C1. Normal atlantodental and atlantoaxial articulations. The bilateral facets are normally aligned.  No fracture or static subluxation of the cervical spine. Cervical vertebral body heights are preserved. Prevertebral soft tissues are normal.  There is mild-to-moderate multilevel cervical spine DDD, likely worse at C3-C4 and C6-C7 with disc space height loss, endplate irregularity and small posteriorly directed disc osteophyte complexes at these locations. Scattered shotty bilateral cervical lymph nodes individually not enlarged by size criteria on this noncontrast examination. There is heterogeneous appearance of the thyroid gland without discrete nodule on  this noncontrast evaluation.  Interval development of a consolidative airspace opacity within the imaged left lung apex, not seen on chest radiograph performed earlier same day.  IMPRESSION: 1. Large (at least 7.4 cm) intraparenchymal hemorrhage centered primarily within the left posterior parietal occipital lobe with associated small to moderate-sized left-sided subdural hematoma and mass effect with approximately 1.5 cm of left-to-right midline shift and uncal herniation. The dominant hemorrhage within the posterior parietal lobe is associated with a smaller (approximately 1.5 cm) intraparenchymal hemorrhage within the subcortical aspect of the contralateral right frontal lobe - constellation of findings are suggestive of recent fall with contrecoup type injury. Clinical correlation is advised. No  displaced calvarial fracture. 2. No fracture or static subluxation of the cervical spine. 3. Interval development of a consolidative airspace opacity within the imaged left lung apex, not seen on chest radiograph performed earlier same day, suggestive of aspiration.  Critical Value/emergent results were called by telephone at the time of interpretation on 09/17/2013 at 8:33 AM to Dr. Serita Grit , who verbally acknowledged these results.   Electronically Signed   By: Sandi Mariscal M.D.   On: 10/02/2013 08:46   Dg Chest Portable 1 View  09/12/2013   CLINICAL DATA:  Evaluate endotracheal tube.  EXAM: PORTABLE CHEST - 1 VIEW  COMPARISON:  10/30/2011  FINDINGS: Endotracheal tube tip lies 3.2 cm above the carina, well positioned.  Lungs clear. Cardiac silhouette is mildly enlarged. Aorta is mildly uncoiled. No mediastinal or hilar masses. No pleural effusion or pneumothorax.  IMPRESSION: 1. Endotracheal tube well positioned with its tip 3.2 cm above the carina. 2. No acute cardiopulmonary disease.   Electronically Signed   By: Lajean Manes M.D.   On: 10/02/2013 08:14    Review of systems not obtained due to patient factors. Blood pressure 146/65, pulse 51, temperature 92.3 F (33.5 C), resp. rate 14, height 5' 10"  (1.778 m), weight 83 kg (182 lb 15.7 oz), SpO2 100.00%. The patient is unconscious lying on an emergency room stretcher intubated and mechanically ventilated. She shows no signs awakening to noxious stimuli. Her pupil on the left side is fixed at 8 mm. Her pupil on the right side is 2 mm and nonreactive. Corneal reflexes are absent bilaterally. She has a weak cough and gag response. She extends both upper and lower extremities to pain. Examination head ears eyes or and throat is unremarkable. Chest and abdomen are unremarkable. Extremities are free from injury deformity.  Assessment/Plan: Left posterior cerebral artery infarction with secondary hemorrhage. Patient with evidence of lower brainstem  function only. No indication for surgical intervention. I recommend critical care admission and stroke team care.  Mallie Mussel A Nidal Rivet 10/07/2013, 9:47 AM

## 2013-09-18 NOTE — ED Notes (Signed)
7.5 ETT placed now by Dr Doy Mince

## 2013-09-18 NOTE — ED Notes (Signed)
cbg 160 

## 2013-09-18 NOTE — ED Notes (Signed)
Patient presents to ED via EMS from home. Patient was found outside unresponsive alone. EMS CBG 140, 20g to left hand, NPA in place. Patient has snoring respirations on arrival to ED.

## 2013-09-18 NOTE — Progress Notes (Signed)
Upon entering trauma C, Chaplain observed pt fully intubated and pt's daughter.  Chaplain gave support to pt's daughter and granddaughter in Trauma C through caring presence, conversation and empathic listening.  Chaplain escorted granddaughter to [facility] waiting area and notified [facility] nurse that pt's family has gathered in [place] waiting area.  Pt's daughter expressed concern about the anxiety of pt's other children who are all in [place].     10/07/2013 1300  Clinical Encounter Type  Visited With Patient and family together  Visit Type Spiritual support;Critical Care  Referral From Physician  Spiritual Encounters  Spiritual Needs Prayer;Emotional  Stress Factors  Family Stress Factors Health changes   Estelle June, chaplain pager [phone]

## 2013-09-18 NOTE — Progress Notes (Signed)
INITIAL NUTRITION ASSESSMENT  DOCUMENTATION CODES Per approved criteria  -Not Applicable   INTERVENTION: If pt remains intubated recommend: Initiate Vital High Protein @ 25 ml/hr and increase by 10 ml every 4 hours to goal rate of 45 ml/hr.   Tube feeding regimen provides 1080 kcal, 94 grams of protein, and 902 ml of H2O.    NUTRITION DIAGNOSIS: Inadequate oral intake related to inability to eat as evidenced by NPO status  Goal: Pt to meet >/= 90% of their estimated nutrition needs   Monitor:  Plan of care  Reason for Assessment: ventilator  80 y.o. female  Admitting Dx: <principal problem not specified>  ASSESSMENT: Patient is currently intubated on ventilator support due to left PCA infarct with hemorrhage. Per MD note not surgical candidate and likely close to herniation. Family wants aggressive care. MV: 8 L/min Temp (24hrs), Avg:92.9 F (33.8 C), Min:91.4 F (33 C), Max:93.9 F (34.4 C) Pt with very low temp.  Unable to complete exam at this time.  Height: Ht Readings from Last 1 Encounters:  09/14/2013 5\' 10"  (1.778 m)    Weight: Wt Readings from Last 1 Encounters:  09/08/2013 182 lb 15.7 oz (83 kg)    Ideal Body Weight: 68.1 kg   % Ideal Body Weight: 122%  Wt Readings from Last 10 Encounters:  09/22/2013 182 lb 15.7 oz (83 kg)  07/28/13 178 lb 9.6 oz (81.012 kg)  02/24/13 171 lb (77.565 kg)  11/04/12 174 lb 3.2 oz (79.017 kg)  11/20/11 170 lb (77.111 kg)  10/30/11 170 lb (77.111 kg)  10/22/11 170 lb 13.7 oz (77.5 kg)  10/22/11 170 lb 13.7 oz (77.5 kg)  10/18/11 170 lb 12.8 oz (77.474 kg)  10/15/11 174 lb (78.926 kg)    Usual Body Weight: 170's  % Usual Body Weight: >100%  BMI:  Body mass index is 26.26 kg/(m^2).  Estimated Nutritional Needs: Kcal: 1099 Protein: 97-110 grams Fluid: >1.5 L/day  Skin: no issues noted  Diet Order: NPO  EDUCATION NEEDS: -No education needs identified at this time  No intake or output data in the 24 hours  ending 09/15/2013 1300  Last BM: PTA   Labs:   Recent Labs Lab 09/28/2013 0755  NA 144  K 3.2*  CL 103  CO2 24  BUN 13  CREATININE 0.71  CALCIUM 9.9  GLUCOSE 184*    CBG (last 3)   Recent Labs  10/05/2013 0755  GLUCAP 161*    Scheduled Meds: . ampicillin-sulbactam (UNASYN) IV  1.5 g Intravenous Q8H  . pantoprazole (PROTONIX) IV  40 mg Intravenous QHS  . potassium chloride  10 mEq Intravenous Q1 Hr x 4  . propofol      . sodium chloride  30 mL Intravenous STAT    Continuous Infusions: . sodium chloride 75 mL/hr at 09/24/2013 1200  . sodium chloride (hypertonic)      Past Medical History  Diagnosis Date  . Hypertension   . Hyperlipemia   . Disorder of bone and cartilage, unspecified   . Arthritis   . Anxiety   . Anemia   . Lung mass 10/2011  . Right bundle branch block and left posterior fascicular block   . Other abnormal blood chemistry   . Unspecified vitamin D deficiency   . Osteoarthrosis, unspecified whether generalized or localized, lower leg   . Screening for iron deficiency anemia   . Localized superficial swelling, mass, or lump   . Screening for diabetes mellitus   . Essential hypertension, benign   .  Routine gynecological examination 07/23/2011  . Special screening for malignant neoplasms, colon     Past Surgical History  Procedure Laterality Date  . Appendectomy  1953  . Back surgery  1975  . Knee surgery  1993  . Tumor in neck  2010    Dr Stephani Police  . Excision lung mass  10/22/2011  . Mass excision  10/22/2011    Procedure: EXCISION MASS;  Surgeon: Nicanor Alcon, MD;  Location: [facility];  Service: Thoracic;  Laterality: Left;  EXCISION LEFT CHEST MASS    [place], [place], [place] Pager [phone] After Hours Pager

## 2013-09-18 NOTE — Procedures (Signed)
Central Venous Catheter Insertion Procedure Note Mishika Flippen [id number] 11-07-1931  Procedure: Insertion of Central Venous Catheter Indications: Drug and/or fluid administration  Procedure Details Consent: Risks of procedure as well as the alternatives and risks of each were explained to the (patient/caregiver).  Consent for procedure obtained. Time Out: Verified patient identification, verified procedure, site/side was marked, verified correct patient position, special equipment/implants available, medications/allergies/relevent history reviewed, required imaging and test results available.  Performed  Maximum sterile technique was used including antiseptics, cap, gloves, gown, hand hygiene, mask and sheet. Skin prep: Chlorhexidine; local anesthetic administered A antimicrobial bonded/coated triple lumen catheter was placed in the left internal jugular vein using the Seldinger technique.  Evaluation Blood flow good Complications: No apparent complications Patient did tolerate procedure well. Chest X-ray ordered to verify placement.  CXR: pending.  Performed under direct MD supervision using ultrasound guidance.  Wire visualized in vessel on u/s.   Marijean Heath, NP 09/08/2013, 12:49 PM  Rigoberto Noel

## 2013-09-18 NOTE — ED Provider Notes (Signed)
CSN: [id number]     Arrival date & time 09/09/2013  0730 History   None    Chief Complaint  Patient presents with  . Altered Mental Status     (Consider location/radiation/quality/duration/timing/severity/associated sxs/prior Treatment) HPI Comments: 80 yo female presenting unresponsive.  Per EMS, she was found outside her apartment by passersby.  No additional history is available.    Level V Caveat due to AMS  Patient is a 80 y.o. female presenting with altered mental status.  Altered Mental Status Presenting symptoms: unresponsiveness   Severity:  Severe Episode history:  Unable to specify   Past Medical History  Diagnosis Date  . Hypertension   . Hyperlipemia   . Disorder of bone and cartilage, unspecified   . Arthritis   . Anxiety   . Anemia   . Lung mass 10/2011  . Right bundle branch block and left posterior fascicular block   . Other abnormal blood chemistry   . Unspecified vitamin D deficiency   . Osteoarthrosis, unspecified whether generalized or localized, lower leg   . Screening for iron deficiency anemia   . Localized superficial swelling, mass, or lump   . Screening for diabetes mellitus   . Essential hypertension, benign   . Routine gynecological examination 07/23/2011  . Special screening for malignant neoplasms, colon    Past Surgical History  Procedure Laterality Date  . Appendectomy  1953  . Back surgery  1975  . Knee surgery  1993  . Tumor in neck  2010    Dr Stephani Police  . Excision lung mass  10/22/2011  . Mass excision  10/22/2011    Procedure: EXCISION MASS;  Surgeon: Nicanor Alcon, MD;  Location: [facility] OR;  Service: Thoracic;  Laterality: Left;  EXCISION LEFT CHEST MASS   Family History  Problem Relation Age of Onset  . Heart disease Mother   . Diabetes Sister   . Heart disease Son    History  Substance Use Topics  . Smoking status: Never Smoker   . Smokeless tobacco: Never Used  . Alcohol Use: No   OB History   Grav Para Term  Preterm Abortions TAB SAB Ect Mult Living                 Review of Systems  Unable to perform ROS: Mental status change      Allergies  Review of patient's allergies indicates no known allergies.  Home Medications   Current Outpatient Rx  Name  Route  Sig  Dispense  Refill  . aspirin 81 MG tablet   Oral   Take 81 mg by mouth daily.         . Cholecalciferol (VITAMIN D-3) 5000 UNITS TABS   Oral   Take 1 tablet by mouth daily.         . ferrous sulfate 325 (65 FE) MG tablet   Oral   Take 1 tablet (325 mg total) by mouth daily with breakfast.   30 tablet   3   . hydrALAZINE (APRESOLINE) 50 MG tablet      Take one tablet  In am and half a tablet in pm   180 tablet   3   . losartan-hydrochlorothiazide (HYZAAR) 100-25 MG per tablet   Oral   Take 1 tablet by mouth daily. Take one tablet once a day for blood pressure   90 tablet   3   . lovastatin (MEVACOR) 20 MG tablet      Take one tablet  once a day for cholesterol   90 tablet   3   . metoprolol succinate (TOPROL-XL) 50 MG 24 hr tablet   Oral   Take 50 mg by mouth daily. Blood preasure          BP 174/98  Pulse 69  Resp 19  Ht 5\' 3"  (1.6 m)  Wt 180 lb (81.647 kg)  BMI 31.89 kg/m2  SpO2 100% Physical Exam  Nursing note and vitals reviewed. Constitutional: She appears well-developed and well-nourished.  HENT:  Head: Normocephalic and atraumatic.  Mouth/Throat: Oropharynx is clear and moist.  Eyes: Conjunctivae are normal. No scleral icterus. Right pupil is round and reactive. Left pupil is not round and not reactive.  Neck: Neck supple.  Cardiovascular: Normal rate, regular rhythm, normal heart sounds and intact distal pulses.   No murmur heard. Pulmonary/Chest: Effort normal and breath sounds normal. No stridor. No respiratory distress. She has no rales.  Sonorous respirations  Abdominal: Soft. Bowel sounds are normal. She exhibits no distension. There is no tenderness.  Musculoskeletal:  Normal range of motion.  Neurological: She is unresponsive. GCS eye subscore is 1. GCS verbal subscore is 1. GCS motor subscore is 2.  Skin: Skin is warm and dry. No rash noted.    ED Course  INTUBATION Date/Time: 10/03/2013 7:57 AM Performed by: Serita Grit DAVID III Authorized by: Serita Grit DAVID III Consent: The procedure was performed in an emergent situation. Indications: airway protection Intubation method: video-assisted Patient status: paralyzed (RSI) Preoxygenation: nonrebreather mask Sedatives: etomidate Paralytic: rocuronium Laryngoscope size: glidescope 3. Tube size: 7.5 mm Tube type: cuffed Number of attempts: 1 Cords visualized: yes Post-procedure assessment: chest rise and CO2 detector Breath sounds: equal Cuff inflated: yes ETT to lip: 23 cm Tube secured with: ETT holder Chest x-ray interpreted by me. Patient tolerance: Patient tolerated the procedure well with no immediate complications.  CRITICAL CARE Performed by: Serita Grit DAVID III Authorized by: Serita Grit DAVID III Total critical care time: 40 minutes Critical care time was exclusive of separately billable procedures and treating other patients. Critical care was necessary to treat or prevent imminent or life-threatening deterioration of the following conditions: CNS failure or compromise. Critical care was time spent personally by me on the following activities: development of treatment plan with patient or surrogate, discussions with consultants, evaluation of patient's response to treatment, examination of patient, obtaining history from patient or surrogate, ordering and performing treatments and interventions, ordering and review of laboratory studies, ordering and review of radiographic studies, pulse oximetry, re-evaluation of patient's condition and review of old charts.   (including critical care time) Labs Review Labs Reviewed  COMPREHENSIVE METABOLIC PANEL - Abnormal; Notable for  the following:    Potassium 3.2 (*)    Glucose, Bld 184 (*)    GFR calc non Af Amer 80 (*)    All other components within normal limits  URINALYSIS, ROUTINE W REFLEX MICROSCOPIC - Abnormal; Notable for the following:    Color, Urine AMBER (*)    APPearance CLOUDY (*)    Protein, ur >300 (*)    All other components within normal limits  CBC WITH DIFFERENTIAL - Abnormal; Notable for the following:    WBC 14.2 (*)    Hemoglobin 10.8 (*)    HCT 34.2 (*)    MCV 75.8 (*)    MCH 23.9 (*)    RDW 15.6 (*)    Neutro Abs 9.6 (*)    All other components within normal limits  URINE  MICROSCOPIC-ADD ON - Abnormal; Notable for the following:    Squamous Epithelial / LPF FEW (*)    All other components within normal limits  CBG MONITORING, ED - Abnormal; Notable for the following:    Glucose-Capillary 161 (*)    All other components within normal limits  I-STAT CG4 LACTIC ACID, ED - Abnormal; Notable for the following:    Lactic Acid, Venous 2.66 (*)    All other components within normal limits  I-STAT ARTERIAL BLOOD GAS, ED - Abnormal; Notable for the following:    pH, Arterial 7.460 (*)    pO2, Arterial 178.0 (*)    Bicarbonate 26.2 (*)    All other components within normal limits  MRSA PCR SCREENING  PROTIME-INR  TROPONIN I  PRO B NATRIURETIC PEPTIDE  TROPONIN I  BLOOD GAS, ARTERIAL  TROPONIN I  TROPONIN I  SODIUM  SODIUM  SODIUM   Imaging Review Ct Head Wo Contrast  09/09/2013   CLINICAL DATA:  Found unresponsive  EXAM: CT HEAD WITHOUT CONTRAST  CT CERVICAL SPINE WITHOUT CONTRAST  TECHNIQUE: Multidetector CT imaging of the head and cervical spine was performed following the standard protocol without intravenous contrast. Multiplanar CT image reconstructions of the cervical spine were also generated.  COMPARISON:  None.  FINDINGS: CT HEAD FINDINGS  There is a large (at least 7.4 x 4.1 cm) intraparenchymal hemorrhage centered primarily within the posterior aspect of the left parietal  occipital lobe (image 17, series 2) which is associated with mass effect and near complete effacement of the left lateral ventricle and approximately 1.5 cm of left to right midline shift and uncal herniation. This intraparenchymal hemorrhage is associated with an approximately 0.9 cm left-sided crescentic subdural hematoma.  There is an additional approximately 1.0 x 1.5 cm intraparenchymal hemorrhage within the subcortical aspect of the contralateral right frontal lobe (image 16, series 2). No definite intraventricular hemorrhage.  The patient is intubated and has a nasogastric tube. There are nonspecific air-fluid levels within the right maxillary sinus and the sphenoid sinus. Minimal polypoid mucosal thickening within the bilateral maxillary sinuses. There is minimal mucosal thickening within the bilateral posterior ethmoidal air cells. Normal appearance of the bilateral frontal sinuses and mastoid air cells.  Regional soft tissues are normal. No displaced calvarial fracture. No radiopaque foreign body.  CT CERVICAL SPINE FINDINGS  C1 to the superior endplate of T1 is imaged.  Normal alignment of the cervical spine. No anterolisthesis or retrolisthesis. The dens is normally positioned between the lateral masses of C1. Normal atlantodental and atlantoaxial articulations. The bilateral facets are normally aligned.  No fracture or static subluxation of the cervical spine. Cervical vertebral body heights are preserved. Prevertebral soft tissues are normal.  There is mild-to-moderate multilevel cervical spine DDD, likely worse at C3-C4 and C6-C7 with disc space height loss, endplate irregularity and small posteriorly directed disc osteophyte complexes at these locations. Scattered shotty bilateral cervical lymph nodes individually not enlarged by size criteria on this noncontrast examination. There is heterogeneous appearance of the thyroid gland without discrete nodule on this noncontrast evaluation.  Interval  development of a consolidative airspace opacity within the imaged left lung apex, not seen on chest radiograph performed earlier same day.  IMPRESSION: 1. Large (at least 7.4 cm) intraparenchymal hemorrhage centered primarily within the left posterior parietal occipital lobe with associated small to moderate-sized left-sided subdural hematoma and mass effect with approximately 1.5 cm of left-to-right midline shift and uncal herniation. The dominant hemorrhage within the posterior parietal lobe is  associated with a smaller (approximately 1.5 cm) intraparenchymal hemorrhage within the subcortical aspect of the contralateral right frontal lobe - constellation of findings are suggestive of recent fall with contrecoup type injury. Clinical correlation is advised. No displaced calvarial fracture. 2. No fracture or static subluxation of the cervical spine. 3. Interval development of a consolidative airspace opacity within the imaged left lung apex, not seen on chest radiograph performed earlier same day, suggestive of aspiration.  Critical Value/emergent results were called by telephone at the time of interpretation on 09/08/2013 at 8:33 AM to Dr. Serita Grit , who verbally acknowledged these results.   Electronically Signed   By: Sandi Mariscal M.D.   On: 09/28/2013 08:46   Ct Cervical Spine Wo Contrast  09/12/2013   CLINICAL DATA:  Found unresponsive  EXAM: CT HEAD WITHOUT CONTRAST  CT CERVICAL SPINE WITHOUT CONTRAST  TECHNIQUE: Multidetector CT imaging of the head and cervical spine was performed following the standard protocol without intravenous contrast. Multiplanar CT image reconstructions of the cervical spine were also generated.  COMPARISON:  None.  FINDINGS: CT HEAD FINDINGS  There is a large (at least 7.4 x 4.1 cm) intraparenchymal hemorrhage centered primarily within the posterior aspect of the left parietal occipital lobe (image 17, series 2) which is associated with mass effect and near complete effacement  of the left lateral ventricle and approximately 1.5 cm of left to right midline shift and uncal herniation. This intraparenchymal hemorrhage is associated with an approximately 0.9 cm left-sided crescentic subdural hematoma.  There is an additional approximately 1.0 x 1.5 cm intraparenchymal hemorrhage within the subcortical aspect of the contralateral right frontal lobe (image 16, series 2). No definite intraventricular hemorrhage.  The patient is intubated and has a nasogastric tube. There are nonspecific air-fluid levels within the right maxillary sinus and the sphenoid sinus. Minimal polypoid mucosal thickening within the bilateral maxillary sinuses. There is minimal mucosal thickening within the bilateral posterior ethmoidal air cells. Normal appearance of the bilateral frontal sinuses and mastoid air cells.  Regional soft tissues are normal. No displaced calvarial fracture. No radiopaque foreign body.  CT CERVICAL SPINE FINDINGS  C1 to the superior endplate of T1 is imaged.  Normal alignment of the cervical spine. No anterolisthesis or retrolisthesis. The dens is normally positioned between the lateral masses of C1. Normal atlantodental and atlantoaxial articulations. The bilateral facets are normally aligned.  No fracture or static subluxation of the cervical spine. Cervical vertebral body heights are preserved. Prevertebral soft tissues are normal.  There is mild-to-moderate multilevel cervical spine DDD, likely worse at C3-C4 and C6-C7 with disc space height loss, endplate irregularity and small posteriorly directed disc osteophyte complexes at these locations. Scattered shotty bilateral cervical lymph nodes individually not enlarged by size criteria on this noncontrast examination. There is heterogeneous appearance of the thyroid gland without discrete nodule on this noncontrast evaluation.  Interval development of a consolidative airspace opacity within the imaged left lung apex, not seen on chest  radiograph performed earlier same day.  IMPRESSION: 1. Large (at least 7.4 cm) intraparenchymal hemorrhage centered primarily within the left posterior parietal occipital lobe with associated small to moderate-sized left-sided subdural hematoma and mass effect with approximately 1.5 cm of left-to-right midline shift and uncal herniation. The dominant hemorrhage within the posterior parietal lobe is associated with a smaller (approximately 1.5 cm) intraparenchymal hemorrhage within the subcortical aspect of the contralateral right frontal lobe - constellation of findings are suggestive of recent fall with contrecoup type injury. Clinical correlation is  advised. No displaced calvarial fracture. 2. No fracture or static subluxation of the cervical spine. 3. Interval development of a consolidative airspace opacity within the imaged left lung apex, not seen on chest radiograph performed earlier same day, suggestive of aspiration.  Critical Value/emergent results were called by telephone at the time of interpretation on 07-Oct-2013 at 8:33 AM to Dr. Serita Grit , who verbally acknowledged these results.   Electronically Signed   By: Sandi Mariscal M.D.   On: 10/07/13 08:46   Dg Chest Portable 1 View  10-07-2013   CLINICAL DATA:  Evaluate endotracheal tube.  EXAM: PORTABLE CHEST - 1 VIEW  COMPARISON:  10/30/2011  FINDINGS: Endotracheal tube tip lies 3.2 cm above the carina, well positioned.  Lungs clear. Cardiac silhouette is mildly enlarged. Aorta is mildly uncoiled. No mediastinal or hilar masses. No pleural effusion or pneumothorax.  IMPRESSION: 1. Endotracheal tube well positioned with its tip 3.2 cm above the carina. 2. No acute cardiopulmonary disease.   Electronically Signed   By: Lajean Manes M.D.   On: Oct 07, 2013 08:14   All radiology studies independently viewed by me.      EKG Interpretation   Date/Time:  2013/10/07 07:35:09 EDT Ventricular Rate:  59 PR Interval:  198 QRS Duration:  166 QT Interval:  502 QTC Calculation: 497 R Axis:   -123 Text Interpretation:  Sinus rhythm IVCD, consider atypical RBBB Abnormal  T, consider ischemia, lateral leads now with nonspecific t wave changes   Confirmed by [facility]  MD, TREY ([id number]) on 07-Oct-2013 7:59:38 AM      MDM   Final diagnoses:  ICH (intracerebral hemorrhage)    80 yo female presenting unresponsive.  GCS 4.  Intubated for airway protection just after arrival.   CT shows large intraparenchymal bleed.  Neurosurgery and critical care consulted. admitted to critical care.  Houston Siren III, MD Oct 07, 2013 [phone]

## 2013-09-18 NOTE — Progress Notes (Signed)
ANTIBIOTIC CONSULT NOTE - INITIAL  Pharmacy Consult for Unasyn Indication: Aspiration pneumonia   No Known Allergies  Patient Measurements: Height: 5\' 10"  (177.8 cm) Weight: 182 lb 15.7 oz (83 kg) IBW/kg (Calculated) : 68.5 Adjusted Body Weight: n/a  Vital Signs: Temp: 91.4 F (33 C) (04/11 1000) BP: 191/73 mmHg (04/11 1000) Pulse Rate: 51 (04/11 1000) Intake/Output from previous day:   Intake/Output from this shift:    Labs:  Recent Labs  09/27/2013 0755  WBC 14.2*  HGB 10.8*  PLT 218  CREATININE 0.71   Estimated Creatinine Clearance: 66.9 ml/min (by C-G formula based on Cr of 0.71). No results found for this basename: VANCOTROUGH, VANCOPEAK, VANCORANDOM, GENTTROUGH, GENTPEAK, GENTRANDOM, TOBRATROUGH, TOBRAPEAK, TOBRARND, AMIKACINPEAK, AMIKACINTROU, AMIKACIN,  in the last 72 hours   Microbiology: No results found for this or any previous visit (from the past 720 hour(s)).  Medical History: Past Medical History  Diagnosis Date  . Hypertension   . Hyperlipemia   . Disorder of bone and cartilage, unspecified   . Arthritis   . Anxiety   . Anemia   . Lung mass 10/2011  . Right bundle branch block and left posterior fascicular block   . Other abnormal blood chemistry   . Unspecified vitamin D deficiency   . Osteoarthrosis, unspecified whether generalized or localized, lower leg   . Screening for iron deficiency anemia   . Localized superficial swelling, mass, or lump   . Screening for diabetes mellitus   . Essential hypertension, benign   . Routine gynecological examination 07/23/2011  . Special screening for malignant neoplasms, colon     Medications:   (Not in a hospital admission) Assessment: 71 YOF found down on 4/11. Intubated in ER. CT head revealed large hemorrhage. Pharmacy to start Unasyn for r/o aspiration pneumonia. F/u CXR ordered. WBC elevated at 14.2, LA 2.66.   Goal of Therapy:  Resolution of infection  Plan:  1) Unasyn 1.5 gm IV Q 8 hours   2) Monitor CBC, renal fx, cultures and patient's clinical progress.   Albertina Parr, PharmD.  Clinical Pharmacist Pager [phone]

## 2013-09-18 NOTE — ED Notes (Signed)
To CT now with this RN

## 2013-09-18 NOTE — Progress Notes (Signed)
To my exam,  Remains unresponsive BL pupils fixed & dilated corneals + spont respirations + Daughter updated Completing bolus of 3% saline  Andrea Fox  [phone]

## 2013-09-18 NOTE — Progress Notes (Signed)
Pt transported on vent to ICU by RT.

## 2013-09-19 ENCOUNTER — Encounter (HOSPITAL_COMMUNITY): Payer: Self-pay | Admitting: Radiology

## 2013-09-19 ENCOUNTER — Inpatient Hospital Stay (HOSPITAL_COMMUNITY): Payer: Medicare Other

## 2013-09-19 LAB — CBC
HEMATOCRIT: 28.6 % — AB (ref 36.0–46.0)
Hemoglobin: 8.9 g/dL — ABNORMAL LOW (ref 12.0–15.0)
MCH: 23.5 pg — ABNORMAL LOW (ref 26.0–34.0)
MCHC: 31.1 g/dL (ref 30.0–36.0)
MCV: 75.7 fL — ABNORMAL LOW (ref 78.0–100.0)
Platelets: 172 10*3/uL (ref 150–400)
RBC: 3.78 MIL/uL — ABNORMAL LOW (ref 3.87–5.11)
RDW: 15.7 % — AB (ref 11.5–15.5)
WBC: 12.6 10*3/uL — ABNORMAL HIGH (ref 4.0–10.5)

## 2013-09-19 LAB — BASIC METABOLIC PANEL
BUN: 11 mg/dL (ref 6–23)
CALCIUM: 9.1 mg/dL (ref 8.4–10.5)
CO2: 23 mEq/L (ref 19–32)
CREATININE: 0.65 mg/dL (ref 0.50–1.10)
Chloride: 119 mEq/L — ABNORMAL HIGH (ref 96–112)
GFR calc non Af Amer: 82 mL/min — ABNORMAL LOW (ref >90)
Glucose, Bld: 122 mg/dL — ABNORMAL HIGH (ref 70–99)
POTASSIUM: 3.5 meq/L — AB (ref 3.7–5.3)
Sodium: 158 mEq/L — ABNORMAL HIGH (ref 137–147)

## 2013-09-19 LAB — SODIUM
SODIUM: 156 meq/L — AB (ref 137–147)
Sodium: 157 mEq/L — ABNORMAL HIGH (ref 137–147)
Sodium: 158 mEq/L — ABNORMAL HIGH (ref 137–147)

## 2013-09-19 LAB — GLUCOSE, CAPILLARY: GLUCOSE-CAPILLARY: 128 mg/dL — AB (ref 70–99)

## 2013-09-19 MED ORDER — LABETALOL HCL 5 MG/ML IV SOLN
INTRAVENOUS | Status: AC
  Filled 2013-09-19: qty 4

## 2013-09-19 MED ORDER — ACETAMINOPHEN 160 MG/5ML PO SOLN
650.0000 mg | Freq: Four times a day (QID) | ORAL | Status: DC | PRN
  Administered 2013-09-19: 650 mg
  Filled 2013-09-19: qty 20.3

## 2013-09-19 MED ORDER — HYDRALAZINE HCL 20 MG/ML IJ SOLN
10.0000 mg | INTRAMUSCULAR | Status: DC | PRN
  Administered 2013-09-19 (×3): 10 mg via INTRAVENOUS
  Administered 2013-09-20: 10:00:00 via INTRAVENOUS
  Administered 2013-09-20 (×2): 10 mg via INTRAVENOUS
  Filled 2013-09-19 (×6): qty 1

## 2013-09-19 MED ORDER — LABETALOL HCL 5 MG/ML IV SOLN
10.0000 mg | INTRAVENOUS | Status: DC | PRN
  Administered 2013-09-19 – 2013-09-20 (×5): 10 mg via INTRAVENOUS
  Filled 2013-09-19 (×4): qty 4

## 2013-09-19 NOTE — Progress Notes (Signed)
Pt transported to/from CT scan on vent w/ no apparent complications.

## 2013-09-19 NOTE — Progress Notes (Signed)
PULMONARY / CRITICAL CARE MEDICINE   Name: Andrea Fox MRN: [id number] DOB: [birth year]    ADMISSION DATE:  09/09/2013  REFERRING MD :  Trenton Gammon PRIMARY SERVICE: PCCM  CHIEF COMPLAINT:  Stroke, ICH  BRIEF PATIENT DESCRIPTION: 80 yo female with hx HTN found down 4/11.  Intubated in ER for airway protection.  CT head revealed large hemorrhage with mass effect.  Seen by nsgy, no surgical intervention planned. PCCM called to admit.   SIGNIFICANT EVENTS / STUDIES:  CT head 4/11>>> large intraparenchymal L post parietal/occipital hemorrhage with associated small to moderate-sized left-sided subdural hematoma and mass effect with approximately 1.5 cm of left-to-right midline shift and uncal herniation.    LINES / TUBES: ETT 4/11>>>  CULTURES: none  ANTIBIOTICS: Unasyn 4/11>>>  INTERVAL HX:  Both pupils now fixed and dilated.  Unresponsive.    VITAL SIGNS: Temp:  [91.4 F (33 C)-100.2 F (37.9 C)] 100.2 F (37.9 C) (04/12 0409) Pulse Rate:  [48-72] 58 (04/12 0746) Resp:  [6-22] 14 (04/12 0746) BP: (119-191)/(38-103) 152/61 mmHg (04/12 0746) SpO2:  [97 %-100 %] 99 % (04/12 0746) FiO2 (%):  [30 %-40 %] 30 % (04/12 0746) HEMODYNAMICS:   VENTILATOR SETTINGS: Vent Mode:  [-] PRVC FiO2 (%):  [30 %-40 %] 30 % Set Rate:  [12 bmp-16 bmp] 12 bmp Vt Set:  [550 mL] 550 mL PEEP:  [5 cmH20] 5 cmH20 Plateau Pressure:  [17 cmH20-21 cmH20] 20 cmH20 INTAKE / OUTPUT: Intake/Output     04/11 0701 - 04/12 0700 04/12 0701 - 04/13 0700   I.V. (mL/kg) 1669.6 (20.1)    IV Piggyback 650    Total Intake(mL/kg) 2319.6 (27.9)    Urine (mL/kg/hr) 1175    Total Output 1175     Net +1144.6            PHYSICAL EXAMINATION: General:  wdwn female, critically ill Neuro:  Unresponsive, no corneals, weak gag/cough, bilat pupils fixed and dilated  HEENT:  Mm dry, c-collar, ETT Cardiovascular:  s1s2 bradycardic Lungs:  resps even non labored, coarse rhonchi  Abdomen:  Soft, hypoactive bs   Musculoskeletal:  Warm and dry, no edema   LABS:  CBC  Recent Labs Lab 09/13/2013 0755  WBC 14.2*  HGB 10.8*  HCT 34.2*  PLT 218   Coag's  Recent Labs Lab 09/12/2013 0755  INR 0.97   BMET  Recent Labs Lab 09/15/2013 0755 09/19/2013 1525 09/30/2013 2057 09/19/13 0300  NA 144 148* 154* 156*  K 3.2*  --   --   --   CL 103  --   --   --   CO2 24  --   --   --   BUN 13  --   --   --   CREATININE 0.71  --   --   --   GLUCOSE 184*  --   --   --    Electrolytes  Recent Labs Lab 10/02/2013 0755  CALCIUM 9.9   Sepsis Markers  Recent Labs Lab 09/14/2013 0822  LATICACIDVEN 2.66*   ABG  Recent Labs Lab 09/28/2013 0841  PHART 7.460*  PCO2ART 36.0  PO2ART 178.0*   Liver Enzymes  Recent Labs Lab 09/30/2013 0755  AST 18  ALT 14  ALKPHOS 73  BILITOT 0.5  ALBUMIN 4.0   Cardiac Enzymes  Recent Labs Lab 09/24/2013 0755 09/24/2013 1059 09/14/2013 1525 09/15/2013 2057  TROPONINI <0.30 <0.30 <0.30 <0.30  PROBNP 51.3  --   --   --    Glucose  Recent Labs Lab 09/19/2013 0755  GLUCAP 161*    Imaging Ct Head Wo Contrast  09/13/2013   CLINICAL DATA:  Found unresponsive  EXAM: CT HEAD WITHOUT CONTRAST  CT CERVICAL SPINE WITHOUT CONTRAST  TECHNIQUE: Multidetector CT imaging of the head and cervical spine was performed following the standard protocol without intravenous contrast. Multiplanar CT image reconstructions of the cervical spine were also generated.  COMPARISON:  None.  FINDINGS: CT HEAD FINDINGS  There is a large (at least 7.4 x 4.1 cm) intraparenchymal hemorrhage centered primarily within the posterior aspect of the left parietal occipital lobe (image 17, series 2) which is associated with mass effect and near complete effacement of the left lateral ventricle and approximately 1.5 cm of left to right midline shift and uncal herniation. This intraparenchymal hemorrhage is associated with an approximately 0.9 cm left-sided crescentic subdural hematoma.  There is an  additional approximately 1.0 x 1.5 cm intraparenchymal hemorrhage within the subcortical aspect of the contralateral right frontal lobe (image 16, series 2). No definite intraventricular hemorrhage.  The patient is intubated and has a nasogastric tube. There are nonspecific air-fluid levels within the right maxillary sinus and the sphenoid sinus. Minimal polypoid mucosal thickening within the bilateral maxillary sinuses. There is minimal mucosal thickening within the bilateral posterior ethmoidal air cells. Normal appearance of the bilateral frontal sinuses and mastoid air cells.  Regional soft tissues are normal. No displaced calvarial fracture. No radiopaque foreign body.  CT CERVICAL SPINE FINDINGS  C1 to the superior endplate of T1 is imaged.  Normal alignment of the cervical spine. No anterolisthesis or retrolisthesis. The dens is normally positioned between the lateral masses of C1. Normal atlantodental and atlantoaxial articulations. The bilateral facets are normally aligned.  No fracture or static subluxation of the cervical spine. Cervical vertebral body heights are preserved. Prevertebral soft tissues are normal.  There is mild-to-moderate multilevel cervical spine DDD, likely worse at C3-C4 and C6-C7 with disc space height loss, endplate irregularity and small posteriorly directed disc osteophyte complexes at these locations. Scattered shotty bilateral cervical lymph nodes individually not enlarged by size criteria on this noncontrast examination. There is heterogeneous appearance of the thyroid gland without discrete nodule on this noncontrast evaluation.  Interval development of a consolidative airspace opacity within the imaged left lung apex, not seen on chest radiograph performed earlier same day.  IMPRESSION: 1. Large (at least 7.4 cm) intraparenchymal hemorrhage centered primarily within the left posterior parietal occipital lobe with associated small to moderate-sized left-sided subdural hematoma  and mass effect with approximately 1.5 cm of left-to-right midline shift and uncal herniation. The dominant hemorrhage within the posterior parietal lobe is associated with a smaller (approximately 1.5 cm) intraparenchymal hemorrhage within the subcortical aspect of the contralateral right frontal lobe - constellation of findings are suggestive of recent fall with contrecoup type injury. Clinical correlation is advised. No displaced calvarial fracture. 2. No fracture or static subluxation of the cervical spine. 3. Interval development of a consolidative airspace opacity within the imaged left lung apex, not seen on chest radiograph performed earlier same day, suggestive of aspiration.  Critical Value/emergent results were called by telephone at the time of interpretation on 09/20/2013 at 8:33 AM to Dr. Serita Grit , who verbally acknowledged these results.   Electronically Signed   By: Sandi Mariscal M.D.   On: 09/08/2013 08:46   Ct Cervical Spine Wo Contrast  10/01/2013   CLINICAL DATA:  Found unresponsive  EXAM: CT HEAD WITHOUT CONTRAST  CT CERVICAL SPINE  WITHOUT CONTRAST  TECHNIQUE: Multidetector CT imaging of the head and cervical spine was performed following the standard protocol without intravenous contrast. Multiplanar CT image reconstructions of the cervical spine were also generated.  COMPARISON:  None.  FINDINGS: CT HEAD FINDINGS  There is a large (at least 7.4 x 4.1 cm) intraparenchymal hemorrhage centered primarily within the posterior aspect of the left parietal occipital lobe (image 17, series 2) which is associated with mass effect and near complete effacement of the left lateral ventricle and approximately 1.5 cm of left to right midline shift and uncal herniation. This intraparenchymal hemorrhage is associated with an approximately 0.9 cm left-sided crescentic subdural hematoma.  There is an additional approximately 1.0 x 1.5 cm intraparenchymal hemorrhage within the subcortical aspect of the  contralateral right frontal lobe (image 16, series 2). No definite intraventricular hemorrhage.  The patient is intubated and has a nasogastric tube. There are nonspecific air-fluid levels within the right maxillary sinus and the sphenoid sinus. Minimal polypoid mucosal thickening within the bilateral maxillary sinuses. There is minimal mucosal thickening within the bilateral posterior ethmoidal air cells. Normal appearance of the bilateral frontal sinuses and mastoid air cells.  Regional soft tissues are normal. No displaced calvarial fracture. No radiopaque foreign body.  CT CERVICAL SPINE FINDINGS  C1 to the superior endplate of T1 is imaged.  Normal alignment of the cervical spine. No anterolisthesis or retrolisthesis. The dens is normally positioned between the lateral masses of C1. Normal atlantodental and atlantoaxial articulations. The bilateral facets are normally aligned.  No fracture or static subluxation of the cervical spine. Cervical vertebral body heights are preserved. Prevertebral soft tissues are normal.  There is mild-to-moderate multilevel cervical spine DDD, likely worse at C3-C4 and C6-C7 with disc space height loss, endplate irregularity and small posteriorly directed disc osteophyte complexes at these locations. Scattered shotty bilateral cervical lymph nodes individually not enlarged by size criteria on this noncontrast examination. There is heterogeneous appearance of the thyroid gland without discrete nodule on this noncontrast evaluation.  Interval development of a consolidative airspace opacity within the imaged left lung apex, not seen on chest radiograph performed earlier same day.  IMPRESSION: 1. Large (at least 7.4 cm) intraparenchymal hemorrhage centered primarily within the left posterior parietal occipital lobe with associated small to moderate-sized left-sided subdural hematoma and mass effect with approximately 1.5 cm of left-to-right midline shift and uncal herniation. The  dominant hemorrhage within the posterior parietal lobe is associated with a smaller (approximately 1.5 cm) intraparenchymal hemorrhage within the subcortical aspect of the contralateral right frontal lobe - constellation of findings are suggestive of recent fall with contrecoup type injury. Clinical correlation is advised. No displaced calvarial fracture. 2. No fracture or static subluxation of the cervical spine. 3. Interval development of a consolidative airspace opacity within the imaged left lung apex, not seen on chest radiograph performed earlier same day, suggestive of aspiration.  Critical Value/emergent results were called by telephone at the time of interpretation on 09/24/2013 at 8:33 AM to Dr. Serita Grit , who verbally acknowledged these results.   Electronically Signed   By: Sandi Mariscal M.D.   On: 09/21/2013 08:46   Dg Chest Port 1 View  09/12/2013   CLINICAL DATA:  Central line placement  EXAM: PORTABLE CHEST - 1 VIEW  COMPARISON:  DG CHEST 1V PORT dated 09/08/2013  FINDINGS: Grossly unchanged cardiac silhouette and mediastinal contours given reduced lung volumes and rotation. Interval placement of a left jugular approach intravenous catheter with tip overlying superior  cavoatrial junction. Interval placement of enteric tube with tip and side port projecting below left hemidiaphragm. Otherwise, stable positioning of remaining support apparatus. No pneumothorax. Grossly unchanged bilateral infrahilar opacities, right greater than left. No new focal airspace opacities. No pleural effusion or pneumothorax. No evidence of edema. No acute osseus abnormalities.  IMPRESSION: 1. Interval placement of left jugular approach intravenous catheter with tip projected over the superior cavoatrial junction. No pneumothorax. 2. Interval placement of enteric tube with tip and side port projecting below the left hemidiaphragm. 3. Decreased lung volumes with unchanged bilateral medial basilar opacities, likely  atelectasis.   Electronically Signed   By: Sandi Mariscal M.D.   On: 09/08/2013 14:12   Dg Chest Portable 1 View  09/11/2013   CLINICAL DATA:  Evaluate endotracheal tube.  EXAM: PORTABLE CHEST - 1 VIEW  COMPARISON:  10/30/2011  FINDINGS: Endotracheal tube tip lies 3.2 cm above the carina, well positioned.  Lungs clear. Cardiac silhouette is mildly enlarged. Aorta is mildly uncoiled. No mediastinal or hilar masses. No pleural effusion or pneumothorax.  IMPRESSION: 1. Endotracheal tube well positioned with its tip 3.2 cm above the carina. 2. No acute cardiopulmonary disease.   Electronically Signed   By: Lajean Manes M.D.   On: 10/01/2013 08:14      ASSESSMENT / PLAN:  NEUROLOGIC ICH - primary pathology from L posterior cerebral artery infarct with secondary hemorrhage per nsgy's interpretation  SDH  P:   Neuro following  F/u CT scan pending this am  Cont 3% saline per neuro  No surgical intervention per nsgy  Poor prognosis  BP management  Need to cont discussions with family  2022-10-21 need formal brian death testing if declines - family remains hopeful and wants full code for now    PULMONARY Acute respiratory failure - r/t neuro event as above  ?aspiration  P:   F/u CXR  F/u ABG  abx as above   CARDIOVASCULAR HTN P:  BP management   RENAL Hypokalemia  Hypernatremia - on 3% P:   K replacement as needed  F/u chem   GASTROINTESTINAL No active issue  P:   npo PPI   HEMATOLOGIC Head bleed as above - not coagulopathic  P:  SCD's  F/u chem   INFECTIOUS ?aspiration  Leukocytosis - mild  P:   Empiric unasyn   ENDOCRINE Hyperglycemia - mild  P:   SSI   Marijean Heath, NP 09/19/2013  8:59 AM Pager: [phone] or [phone]  Summary - catastrophic PCA stroke with associated bleed with midline shift & BL blown pupils s/o uncal herniation, dismal prognosis discussed with daughter, who wants to try all possible interventions. She is waiting on other  family members to arrive. May progress to brain death.   I have personally obtained a history, examined the patient, evaluated laboratory and imaging results, formulated the assessment and plan and placed orders. CRITICAL CARE: The patient is critically ill with multiple organ systems failure and requires high complexity decision making for assessment and support, frequent evaluation and titration of therapies, application of advanced monitoring technologies and extensive interpretation of multiple databases. Critical Care Time devoted to patient care services described in this note is 32 minutes.   Kara Mead MD. Shade Flood. [facility] Pager [phone] If no response call [phone]

## 2013-09-19 NOTE — Progress Notes (Addendum)
Stroke Team Progress Note  HISTORY  Andrea Fox is an 80 y.o. female who lives alone. Patient is unable to provide any history. Went out to dinner with her family last evening and was doing fine. It seems that she was able to get up this morning and get dressed. Was found on the ground outside by a passerby. EMS was called. Patient remained unresponsive on presentation. Was intubated in the ED and started on Propofol.  Date last known well: Date: 09/17/2013  Time last known well: Time: 21:00  tPA Given: No: ICH   SUBJECTIVE No family is at bedside. The patient is intubated, comatose.  OBJECTIVE Most recent Vital Signs: Filed Vitals:   09/19/13 0100 09/19/13 0200 09/19/13 0409 09/19/13 0746  BP: 154/57 157/58 180/64 152/61  Pulse: 59 58 60 58  Temp: 100.2 F (37.9 C) 100.2 F (37.9 C) 100.2 F (37.9 C)   TempSrc:      Resp: 12 21 8 14   Height:      Weight:      SpO2: 100% 100% 100% 99%   CBG (last 3)   Recent Labs  09/25/2013 0755  GLUCAP 161*    IV Fluid Intake:   . propofol Stopped (09/27/2013 1400)  . sodium chloride (hypertonic) 75 mL/hr at 09/10/2013 2208    MEDICATIONS  . ampicillin-sulbactam (UNASYN) IV  1.5 g Intravenous Q8H  . antiseptic oral rinse  15 mL Mouth Rinse q12n4p  . chlorhexidine  15 mL Mouth Rinse BID  . famotidine (PEPCID) IV  20 mg Intravenous Q12H  . labetalol       PRN:  sodium chloride, hydrALAZINE, labetalol  Diet:  NPO  Activity:  Bedrest DVT Prophylaxis:  SCD  CLINICALLY SIGNIFICANT STUDIES Basic Metabolic Panel:  Recent Labs Lab 09/14/2013 0755  09/09/2013 2057 09/19/13 0300  NA 144  < > 154* 156*  K 3.2*  --   --   --   CL 103  --   --   --   CO2 24  --   --   --   GLUCOSE 184*  --   --   --   BUN 13  --   --   --   CREATININE 0.71  --   --   --   CALCIUM 9.9  --   --   --   < > = values in this interval not displayed. Liver Function Tests:  Recent Labs Lab 10/02/2013 0755  AST 18  ALT 14  ALKPHOS 73  BILITOT 0.5  PROT  7.3  ALBUMIN 4.0   CBC:  Recent Labs Lab 09/25/2013 0755  WBC 14.2*  NEUTROABS 9.6*  HGB 10.8*  HCT 34.2*  MCV 75.8*  PLT 218   Coagulation:  Recent Labs Lab 10/03/2013 0755  LABPROT 12.7  INR 0.97   Cardiac Enzymes:  Recent Labs Lab 09/08/2013 1059 10/05/2013 1525 09/19/2013 2057  TROPONINI <0.30 <0.30 <0.30   Urinalysis:  Recent Labs Lab 09/14/2013 0757  COLORURINE AMBER*  LABSPEC 1.030  PHURINE 5.5  GLUCOSEU NEGATIVE  HGBUR NEGATIVE  BILIRUBINUR NEGATIVE  KETONESUR NEGATIVE  PROTEINUR >300*  UROBILINOGEN 0.2  NITRITE NEGATIVE  LEUKOCYTESUR NEGATIVE   Lipid Panel    Component Value Date/Time   TRIG 91 07/28/2013 1514   HDL 55 07/28/2013 1514   CHOLHDL 3.1 07/28/2013 1514   LDLCALC 97 07/28/2013 1514   HgbA1C  No results found for this basename: HGBA1C    Urine Drug Screen:   No results found  for this basename: labopia, cocainscrnur, labbenz, amphetmu, thcu, labbarb    Alcohol Level: No results found for this basename: ETH,  in the last 168 hours  Ct Head Wo Contrast  10/15/2013   CLINICAL DATA:  Found unresponsive  EXAM: CT HEAD WITHOUT CONTRAST  CT CERVICAL SPINE WITHOUT CONTRAST  TECHNIQUE: Multidetector CT imaging of the head and cervical spine was performed following the standard protocol without intravenous contrast. Multiplanar CT image reconstructions of the cervical spine were also generated.  COMPARISON:  None.  FINDINGS: CT HEAD FINDINGS  There is a large (at least 7.4 x 4.1 cm) intraparenchymal hemorrhage centered primarily within the posterior aspect of the left parietal occipital lobe (image 17, series 2) which is associated with mass effect and near complete effacement of the left lateral ventricle and approximately 1.5 cm of left to right midline shift and uncal herniation. This intraparenchymal hemorrhage is associated with an approximately 0.9 cm left-sided crescentic subdural hematoma.  There is an additional approximately 1.0 x 1.5 cm  intraparenchymal hemorrhage within the subcortical aspect of the contralateral right frontal lobe (image 16, series 2). No definite intraventricular hemorrhage.  The patient is intubated and has a nasogastric tube. There are nonspecific air-fluid levels within the right maxillary sinus and the sphenoid sinus. Minimal polypoid mucosal thickening within the bilateral maxillary sinuses. There is minimal mucosal thickening within the bilateral posterior ethmoidal air cells. Normal appearance of the bilateral frontal sinuses and mastoid air cells.  Regional soft tissues are normal. No displaced calvarial fracture. No radiopaque foreign body.  CT CERVICAL SPINE FINDINGS  C1 to the superior endplate of T1 is imaged.  Normal alignment of the cervical spine. No anterolisthesis or retrolisthesis. The dens is normally positioned between the lateral masses of C1. Normal atlantodental and atlantoaxial articulations. The bilateral facets are normally aligned.  No fracture or static subluxation of the cervical spine. Cervical vertebral body heights are preserved. Prevertebral soft tissues are normal.  There is mild-to-moderate multilevel cervical spine DDD, likely worse at C3-C4 and C6-C7 with disc space height loss, endplate irregularity and small posteriorly directed disc osteophyte complexes at these locations. Scattered shotty bilateral cervical lymph nodes individually not enlarged by size criteria on this noncontrast examination. There is heterogeneous appearance of the thyroid gland without discrete nodule on this noncontrast evaluation.  Interval development of a consolidative airspace opacity within the imaged left lung apex, not seen on chest radiograph performed earlier same day.  IMPRESSION: 1. Large (at least 7.4 cm) intraparenchymal hemorrhage centered primarily within the left posterior parietal occipital lobe with associated small to moderate-sized left-sided subdural hematoma and mass effect with approximately 1.5  cm of left-to-right midline shift and uncal herniation. The dominant hemorrhage within the posterior parietal lobe is associated with a smaller (approximately 1.5 cm) intraparenchymal hemorrhage within the subcortical aspect of the contralateral right frontal lobe - constellation of findings are suggestive of recent fall with contrecoup type injury. Clinical correlation is advised. No displaced calvarial fracture. 2. No fracture or static subluxation of the cervical spine. 3. Interval development of a consolidative airspace opacity within the imaged left lung apex, not seen on chest radiograph performed earlier same day, suggestive of aspiration.  Critical Value/emergent results were called by telephone at the time of interpretation on Oct 15, 2013 at 8:33 AM to Dr. Serita Grit , who verbally acknowledged these results.   Electronically Signed   By: Sandi Mariscal M.D.   On: 2013-10-15 08:46   Ct Cervical Spine Wo Contrast  10/15/2013  CLINICAL DATA:  Found unresponsive  EXAM: CT HEAD WITHOUT CONTRAST  CT CERVICAL SPINE WITHOUT CONTRAST  TECHNIQUE: Multidetector CT imaging of the head and cervical spine was performed following the standard protocol without intravenous contrast. Multiplanar CT image reconstructions of the cervical spine were also generated.  COMPARISON:  None.  FINDINGS: CT HEAD FINDINGS  There is a large (at least 7.4 x 4.1 cm) intraparenchymal hemorrhage centered primarily within the posterior aspect of the left parietal occipital lobe (image 17, series 2) which is associated with mass effect and near complete effacement of the left lateral ventricle and approximately 1.5 cm of left to right midline shift and uncal herniation. This intraparenchymal hemorrhage is associated with an approximately 0.9 cm left-sided crescentic subdural hematoma.  There is an additional approximately 1.0 x 1.5 cm intraparenchymal hemorrhage within the subcortical aspect of the contralateral right frontal lobe (image 16,  series 2). No definite intraventricular hemorrhage.  The patient is intubated and has a nasogastric tube. There are nonspecific air-fluid levels within the right maxillary sinus and the sphenoid sinus. Minimal polypoid mucosal thickening within the bilateral maxillary sinuses. There is minimal mucosal thickening within the bilateral posterior ethmoidal air cells. Normal appearance of the bilateral frontal sinuses and mastoid air cells.  Regional soft tissues are normal. No displaced calvarial fracture. No radiopaque foreign body.  CT CERVICAL SPINE FINDINGS  C1 to the superior endplate of T1 is imaged.  Normal alignment of the cervical spine. No anterolisthesis or retrolisthesis. The dens is normally positioned between the lateral masses of C1. Normal atlantodental and atlantoaxial articulations. The bilateral facets are normally aligned.  No fracture or static subluxation of the cervical spine. Cervical vertebral body heights are preserved. Prevertebral soft tissues are normal.  There is mild-to-moderate multilevel cervical spine DDD, likely worse at C3-C4 and C6-C7 with disc space height loss, endplate irregularity and small posteriorly directed disc osteophyte complexes at these locations. Scattered shotty bilateral cervical lymph nodes individually not enlarged by size criteria on this noncontrast examination. There is heterogeneous appearance of the thyroid gland without discrete nodule on this noncontrast evaluation.  Interval development of a consolidative airspace opacity within the imaged left lung apex, not seen on chest radiograph performed earlier same day.  IMPRESSION: 1. Large (at least 7.4 cm) intraparenchymal hemorrhage centered primarily within the left posterior parietal occipital lobe with associated small to moderate-sized left-sided subdural hematoma and mass effect with approximately 1.5 cm of left-to-right midline shift and uncal herniation. The dominant hemorrhage within the posterior  parietal lobe is associated with a smaller (approximately 1.5 cm) intraparenchymal hemorrhage within the subcortical aspect of the contralateral right frontal lobe - constellation of findings are suggestive of recent fall with contrecoup type injury. Clinical correlation is advised. No displaced calvarial fracture. 2. No fracture or static subluxation of the cervical spine. 3. Interval development of a consolidative airspace opacity within the imaged left lung apex, not seen on chest radiograph performed earlier same day, suggestive of aspiration.  Critical Value/emergent results were called by telephone at the time of interpretation on 10/04/2013 at 8:33 AM to Dr. Serita Grit , who verbally acknowledged these results.   Electronically Signed   By: Sandi Mariscal M.D.   On: 09/13/2013 08:46   Dg Chest Port 1 View  09/14/2013   CLINICAL DATA:  Central line placement  EXAM: PORTABLE CHEST - 1 VIEW  COMPARISON:  DG CHEST 1V PORT dated 10/01/2013  FINDINGS: Grossly unchanged cardiac silhouette and mediastinal contours given reduced lung volumes  and rotation. Interval placement of a left jugular approach intravenous catheter with tip overlying superior cavoatrial junction. Interval placement of enteric tube with tip and side port projecting below left hemidiaphragm. Otherwise, stable positioning of remaining support apparatus. No pneumothorax. Grossly unchanged bilateral infrahilar opacities, right greater than left. No new focal airspace opacities. No pleural effusion or pneumothorax. No evidence of edema. No acute osseus abnormalities.  IMPRESSION: 1. Interval placement of left jugular approach intravenous catheter with tip projected over the superior cavoatrial junction. No pneumothorax. 2. Interval placement of enteric tube with tip and side port projecting below the left hemidiaphragm. 3. Decreased lung volumes with unchanged bilateral medial basilar opacities, likely atelectasis.   Electronically Signed   By: Sandi Mariscal M.D.   On: 09/08/2013 14:12   Dg Chest Portable 1 View  09/20/2013   CLINICAL DATA:  Evaluate endotracheal tube.  EXAM: PORTABLE CHEST - 1 VIEW  COMPARISON:  10/30/2011  FINDINGS: Endotracheal tube tip lies 3.2 cm above the carina, well positioned.  Lungs clear. Cardiac silhouette is mildly enlarged. Aorta is mildly uncoiled. No mediastinal or hilar masses. No pleural effusion or pneumothorax.  IMPRESSION: 1. Endotracheal tube well positioned with its tip 3.2 cm above the carina. 2. No acute cardiopulmonary disease.   Electronically Signed   By: Lajean Manes M.D.   On: 09/15/2013 08:14    CT of the brain   IMPRESSION:  1. Large (at least 7.4 cm) intraparenchymal hemorrhage centered  primarily within the left posterior parietal occipital lobe with  associated small to moderate-sized left-sided subdural hematoma and  mass effect with approximately 1.5 cm of left-to-right midline shift  and uncal herniation. The dominant hemorrhage within the posterior  parietal lobe is associated with a smaller (approximately 1.5 cm)  intraparenchymal hemorrhage within the subcortical aspect of the  contralateral right frontal lobe - constellation of findings are  suggestive of recent fall with contrecoup type injury. Clinical  correlation is advised. No displaced calvarial fracture.  2. No fracture or static subluxation of the cervical spine.  3. Interval development of a consolidative airspace opacity within  the imaged left lung apex, not seen on chest radiograph performed  earlier same day, suggestive of aspiration.  MRI of the brain    MRA of the brain    2D Echocardiogram    Carotid Doppler    CXR   IMPRESSION:  1. Endotracheal tube well positioned with its tip 3.2 cm above the  carina.  2. No acute cardiopulmonary disease.  EKG    Sinus rhythm IVCD, consider atypical RBBB Abnormal T, consider ischemia, lateral leads  Therapy Recommendations Pending  Physical Exam  General:  The patient is  comatose, and intubated at the time of the examination   Cardiovascular: Regular rate and rhythm, no obvious murmurs.  Respiratory: Lung fields are clear  Abdomen: Soft, nontender, minimal bowel sounds.  Skin: No significant peripheral edema is noted.   Neurologic Exam  Mental status: The patient is intubated, comatose  Cranial nerves: Facial symmetry is present. Anisocoria is noted, left pupil is 5 mm, right pupil is 3-4 mm, and neither pupil reacts well to light. Minimal doll's eyes is seen. Positive corneals are noted bilaterally.  Motor: The patient has decreased motor tone of all 4 extremities to  Sensory examination: With sternal rub, there is some bilateral extension of the lower extremities. Minimal response is seen with deep pain stimulation of the upper extremities.  Coordination: The patient could not cooperate for cerebellar testing.  Gait and station: The patient could not be ambulated.  Reflexes: Deep tendon reflexes are symmetric.    ASSESSMENT Ms. Andrea Fox is a 80 y.o. female presenting with left posterior cerebral artery distribution stroke, hemorrhagic conversion associated with subdural hematoma. The patient appears to have malignant edema associated with this infarct with significant shift from left to right.   Left posterior cerebral artery stroke, hemorrhagic conversion  Hypertension  Dyslipidemia  Hospital day # 1  Clinical examination today shows minimal responsiveness on the part the patient, CTs of the head on admission shows significant edema, left-to-right shift, prognosis is guarded at this time. Evidence by clinical examination of uncal herniation on the left.  TREATMENT/PLAN  No antiplatelet agents  Supportive care, intubation, critical care is following and managing  Hypertonic saline therapy  Repeat CT scan brain this a.m.  Will need to discuss prognosis with family once again, patient currently is full  code  Kathrynn Ducking  09/19/2013 7:48 AM

## 2013-09-19 NOTE — Progress Notes (Signed)
Chaplain paged to offer prayer.  Family received chaplain presence and prayer.  Chaplain will follow up with floor chaplain on 09/20/13 to revisit family and possibly assist in securing short term housing.

## 2013-09-20 ENCOUNTER — Inpatient Hospital Stay (HOSPITAL_COMMUNITY): Payer: Medicare Other

## 2013-09-20 LAB — BASIC METABOLIC PANEL
BUN: 12 mg/dL (ref 6–23)
CALCIUM: 9.8 mg/dL (ref 8.4–10.5)
CO2: 23 mEq/L (ref 19–32)
Chloride: 120 mEq/L — ABNORMAL HIGH (ref 96–112)
Creatinine, Ser: 0.58 mg/dL (ref 0.50–1.10)
GFR calc Af Amer: >90 mL/min (ref >90)
GFR, EST NON AFRICAN AMERICAN: 85 mL/min — AB (ref >90)
Glucose, Bld: 127 mg/dL — ABNORMAL HIGH (ref 70–99)
Potassium: 3.1 mEq/L — ABNORMAL LOW (ref 3.7–5.3)
Sodium: 157 mEq/L — ABNORMAL HIGH (ref 137–147)

## 2013-09-20 LAB — CBC WITH DIFFERENTIAL/PLATELET
BASOS PCT: 0 % (ref 0–1)
Basophils Absolute: 0 10*3/uL (ref 0.0–0.1)
EOS ABS: 0 10*3/uL (ref 0.0–0.7)
Eosinophils Relative: 0 % (ref 0–5)
HCT: 30.9 % — ABNORMAL LOW (ref 36.0–46.0)
Hemoglobin: 9.7 g/dL — ABNORMAL LOW (ref 12.0–15.0)
Lymphocytes Relative: 6 % — ABNORMAL LOW (ref 12–46)
Lymphs Abs: 0.9 10*3/uL (ref 0.7–4.0)
MCH: 23.8 pg — AB (ref 26.0–34.0)
MCHC: 31.4 g/dL (ref 30.0–36.0)
MCV: 75.9 fL — ABNORMAL LOW (ref 78.0–100.0)
Monocytes Absolute: 1.6 10*3/uL — ABNORMAL HIGH (ref 0.1–1.0)
Monocytes Relative: 11 % (ref 3–12)
NEUTROS PCT: 83 % — AB (ref 43–77)
Neutro Abs: 12.4 10*3/uL — ABNORMAL HIGH (ref 1.7–7.7)
Platelets: 154 10*3/uL (ref 150–400)
RBC: 4.07 MIL/uL (ref 3.87–5.11)
RDW: 15.7 % — ABNORMAL HIGH (ref 11.5–15.5)
WBC: 14.9 10*3/uL — ABNORMAL HIGH (ref 4.0–10.5)

## 2013-09-20 LAB — SODIUM: Sodium: 159 mEq/L — ABNORMAL HIGH (ref 137–147)

## 2013-09-20 MED ORDER — POTASSIUM CHLORIDE 10 MEQ/50ML IV SOLN
10.0000 meq | INTRAVENOUS | Status: AC
  Administered 2013-09-20 (×4): 10 meq via INTRAVENOUS
  Filled 2013-09-20: qty 50

## 2013-09-20 MED ORDER — SODIUM CHLORIDE 0.9 % IV SOLN
500.0000 mL | INTRAVENOUS | Status: DC
  Administered 2013-09-20: 09:00:00 via INTRAVENOUS
  Administered 2013-09-21: 50 mL/h via INTRAVENOUS

## 2013-09-20 MED ORDER — NICARDIPINE HCL IN NACL 20-0.86 MG/200ML-% IV SOLN
3.0000 mg/h | INTRAVENOUS | Status: DC
  Administered 2013-09-20: 5 mg/h via INTRAVENOUS
  Filled 2013-09-20 (×2): qty 200

## 2013-09-20 MED ORDER — FAMOTIDINE 40 MG/5ML PO SUSR: 20.0000 mg | Freq: Two times a day (BID) | ORAL | Status: DC

## 2013-09-20 MED ORDER — SODIUM CHLORIDE 0.9 % IV SOLN
3.0000 g | Freq: Four times a day (QID) | INTRAVENOUS | Status: DC
  Administered 2013-09-20 – 2013-09-21 (×4): 3 g via INTRAVENOUS
  Filled 2013-09-20 (×7): qty 3

## 2013-09-20 MED ORDER — LABETALOL HCL 5 MG/ML IV SOLN
10.0000 mg | INTRAVENOUS | Status: DC | PRN
  Administered 2013-09-20 (×3): 20 mg via INTRAVENOUS
  Filled 2013-09-20: qty 8
  Filled 2013-09-20: qty 4

## 2013-09-20 MED ORDER — HYDRALAZINE HCL 20 MG/ML IJ SOLN
10.0000 mg | INTRAMUSCULAR | Status: DC | PRN
  Administered 2013-09-20: 40 mg via INTRAVENOUS
  Filled 2013-09-20: qty 2

## 2013-09-20 MED ORDER — FAMOTIDINE 40 MG/5ML PO SUSR
20.0000 mg | Freq: Two times a day (BID) | ORAL | Status: DC
  Administered 2013-09-20 – 2013-09-23 (×6): 20 mg
  Filled 2013-09-20 (×7): qty 2.5

## 2013-09-20 NOTE — Progress Notes (Signed)
Chaplain responded to page with a request for prayer.  Pt is intubated and non-responsive, but the pt's children and grandchildren have gathered and wanted a time of prayer.  Family is hoping for a miracle healing, but are beginning to process a more grim prognosis. Chaplain offered emotional and spiritual support through empathetic listening and prayer.  Chaplain is available for the family should they desire.   09/20/13 1700  Clinical Encounter Type  Visited With Family;Health care provider  Visit Type Follow-up;Spiritual support;Critical Care  Referral From Nurse  Spiritual Encounters  Spiritual Needs Prayer;Emotional  Stress Factors  Patient Stress Factors None identified  Family Stress Factors Health changes;Major life changes    [place]

## 2013-09-20 NOTE — Progress Notes (Signed)
BPs in 180s and 190s despite hydralazine and labetelol. Discussed with Dr. Alva Garnet.

## 2013-09-20 NOTE — Progress Notes (Signed)
BP remains high despite increase in labetelol and hydralazine doses. Call placed to e link.

## 2013-09-20 NOTE — Clinical Documentation Improvement (Signed)
Possible Clinical Conditions? Aspiration Pneumonia (POA?) Gram Negative Pneumonia (POA? Bacterial pneumonia, specify type if known (POA? Staph/MRSA PNA Strep PNA  Pneumonia (CAP, HAP) (POA?) Other Condition Cannot Clinically Determine   Supporting Information:(As per notes) INFECTIOUS  ?&aspiration Leukocytosis - mild   Pharmacy Consult for Unasyn  Indication:  Aspiration pneumonia Pharmacy to start Unasyn for r/o aspiration pneumonia. F/u CXR ordered. WBC elevated at 14.2, LA 2.66.  Thank You, Alessandra Grout, RN, BSN, CCDS, Clinical Documentation Specialist:  [phone]   [phone]=[employer]- Health Information Management

## 2013-09-20 NOTE — Progress Notes (Signed)
[facility] Progress Note Patient Name: Andrea Fox DOB: 24-May-1932 MRN: [id number]  Date of Service  09/20/2013   HPI/Events of Note   Pt remains HTNive  eICU Interventions  Start cardene drip   Intervention Category Major Interventions: Acid-Base disturbance - evaluation and management;Hypertension - evaluation and management  Elsie Stain 09/20/2013, 4:23 PM

## 2013-09-20 NOTE — Progress Notes (Signed)
PULMONARY / CRITICAL CARE MEDICINE   Name: Andrea Fox MRN: [id number] DOB: 02/14/1932    ADMISSION DATE:  09/29/2013  REFERRING MD :  Trenton Gammon PRIMARY SERVICE: PCCM  CHIEF COMPLAINT:  Stroke, ICH  BRIEF PATIENT DESCRIPTION:  80 yo female with hx HTN found down 4/11.  Intubated in ER for airway protection.  CT head revealed large hemorrhage with mass effect.  Seen by nsgy, no surgical intervention planned. PCCM called to admit.   SIGNIFICANT EVENTS / STUDIES:  4/11 NS Consult: Left posterior cerebral artery infarction with secondary hemorrhage. Patient with evidence of lower brainstem function only. No indication for surgical intervention. I recommend critical care admission and stroke team care.  4/11 Stroke Team Consult: Poor prognosis conveyed to family. Hypertonic saline protocol initiated 4/11 CT head:  large intraparenchymal L post parietal/occipital hemorrhage with associated small to moderate-sized left-sided subdural hematoma and mass effect with approximately 1.5 cm of left-to-right midline shift and uncal herniation.  4/12 CT head:  Interval increase in size of large left hemispheric intracranial hemorrhage centered in the left parietal region with marked amount of surrounding vasogenic edema involving left temporal lobe, posterior left frontal lobe, left parietal lobe and left occipital lobe. Breakthrough of hemorrhage into the ventricle with moderate amount of blood within the right lateral ventricle. Midline shift to the right by 1.8 cm relatively similar to prior exam. Trapping of the right lateral ventricle with dilated the right lateral ventricle. Abnormal appearance of the basal ganglia/brainstem may indicate infarct and/or extension or edema from intracranial hemorrhage.  Marked loss all of sulci and cisterns consistent with uncal and downward herniation (low lying cerebellar tonsils with crowding of  the foramen magnum). Left subdural hematoma measures up to 6.8 mm maximal  thickness without significant change. 4/13 CT head: 1. Stable large left parietal lobe intracranial hemorrhage with severe surrounding vasogenic edema. There is stable intraventricular hemorrhage. There is stable left-to-right midline shift. There is stable abnormal appearance of the brainstem which may indicate extension of infarction versus vasogenic edema. There is stable loss of sulcation and the basal cisterns consistent with increased intracranial pressure with crowding of the foramen magnum and low lying cerebellar tonsils. Stable 5 mm left subdural hematoma. 4/13: Remains comatose with fixed pupils. Spontaneous respiratory effort present. Hypertonic saline discontinued  LINES / TUBES: ETT 4/11 >>  L IJ CVL 4/11 >>   CULTURES:   ANTIBIOTICS: Unasyn 4/11 >>   INTERVAL HX:   Remains comatose with fixed pupils. Spontaneous respiratory effort present   VITAL SIGNS: Temp:  [94.5 F (34.7 C)-98.4 F (36.9 C)] 96.6 F (35.9 C) (04/13 1200) Pulse Rate:  [56-97] 70 (04/13 1200) Resp:  [7-23] 23 (04/13 1200) BP: (151-189)/(53-75) 168/66 mmHg (04/13 1200) SpO2:  [97 %-100 %] 99 % (04/13 1200) FiO2 (%):  [30 %] 30 % (04/13 1231) Weight:  [79.5 kg (175 lb 4.3 oz)] 79.5 kg (175 lb 4.3 oz) (04/13 0159) HEMODYNAMICS:   VENTILATOR SETTINGS: Vent Mode:  [-] PSV;CPAP FiO2 (%):  [30 %] 30 % Set Rate:  [12 bmp] 12 bmp Vt Set:  [550 mL] 550 mL PEEP:  [5 cmH20] 5 cmH20 Pressure Support:  [10 cmH20] 10 cmH20 Plateau Pressure:  [17 cmH20] 17 cmH20 INTAKE / OUTPUT: Intake/Output     04/12 0701 - 04/13 0700 04/13 0701 - 04/14 0700   I.V. (mL/kg)     IV Piggyback 250    Total Intake(mL/kg) 250 (3.1)    Urine (mL/kg/hr) 1215 (0.6)    Emesis/NG output  225 (0.1)    Total Output 1440     Net -1190            PHYSICAL EXAMINATION: General: Comatose Neuro:  no corneals, weak gag/cough, bilat pupils fixed and dilated  HEENT:  Mm dry, c-collar, ETT Cardiovascular:  s1s2 bradycardic Lungs:   resps even non labored, coarse rhonchi  Abdomen:  Soft, hypoactive bs  Musculoskeletal:  Warm and dry, no edema   LABS: I have reviewed all of today's lab results. Relevant abnormalities are discussed in the A/P section   ASSESSMENT / PLAN:  NEUROLOGIC Massive L post cerebral artery CVA ICH due to hemorrhagic transformation Cerebral edema Partial brain herniation Poor neurologic prognosis P:   Further eval and mgmt per Stroke team Hope to meet with family to further discuss goals of care  PULMONARY Acute respiratory failure due to AMS Doubt aspiration  P:   Wean as tolerated in PSV mode Cont vent bundle Tracheostomy tube not currently an option   CARDIOVASCULAR HTN P:  BP management   RENAL Hypokalemia  Hypernatremia P:   Monitor BMET intermittently Monitor I/Os Correct electrolytes as indicated Allow auto-correction of Na  GASTROINTESTINAL No active issue  P:   Cont SUP Cont TFs  HEMATOLOGIC No issues P:  DVT px: SCD's  Monitor CBC intermittently  INFECTIOUS Fever - likely central Doubt aspiration  Leukocytosis - mild  P:   Cont empiric unasyn for now Recheck CXR in AM and consider DC of abx   ENDOCRINE Hyperglycemia - mild  P:   Cont SSI   CCM time: 35 mins  Merton Border, MD ; [facility] service Mobile [phone].  After 5:30 PM or weekends, call [phone]

## 2013-09-20 NOTE — Progress Notes (Signed)
ANTIBIOTIC CONSULT NOTE - FOLLOW UP  Pharmacy Consult for Unasyn Indication: Aspiration pneumonia  No Known Allergies Patient Measurements: Height: 5\' 10"  (177.8 cm) Weight: 175 lb 4.3 oz (79.5 kg) IBW/kg (Calculated) : 68.5 Vital Signs: Temp: 97.9 F (36.6 C) (04/13 0600) BP: 167/67 mmHg (04/13 0600) Pulse Rate: 72 (04/13 0600) Intake/Output from previous day: 04/12 0701 - 04/13 0700 In: 250 [IV Piggyback:250] Out: 1440 [Urine:1215; Emesis/NG output:225] Labs:  Recent Labs  2013-09-28 0755 09/19/13 0900 09/20/13 0424  WBC 14.2* 12.6* 14.9*  HGB 10.8* 8.9* 9.7*  PLT 218 172 154  CREATININE 0.71 0.65 0.58   Estimated Creatinine Clearance: 61.7 ml/min (by C-G formula based on Cr of 0.58). Would estimate CrCl off SCr 0.8-1 in this elderly patient with estimated CrCl ~ 55-73mL/min.  Microbiology: Recent Results (from the past 720 hour(s))  MRSA PCR SCREENING     Status: None   Collection Time    09-28-13 11:58 AM      Result Value Ref Range Status   MRSA by PCR NEGATIVE  NEGATIVE Final   Comment:            The GeneXpert MRSA Assay (FDA     approved for NASAL specimens     only), is one component of a     comprehensive MRSA colonization     surveillance program. It is not     intended to diagnose MRSA     infection nor to guide or     monitor treatment for     MRSA infections.    Anti-infectives   Start     Dose/Rate Route Frequency Ordered Stop   09/20/13 1000  Ampicillin-Sulbactam (UNASYN) 3 g in sodium chloride 0.9 % 100 mL IVPB     3 g 100 mL/hr over 60 Minutes Intravenous Every 6 hours 09/20/13 0942     2013/09/28 1100  ampicillin-sulbactam (UNASYN) 1.5 g in sodium chloride 0.9 % 50 mL IVPB  Status:  Discontinued     1.5 g 100 mL/hr over 30 Minutes Intravenous Every 8 hours 09/28/2013 1025 09/20/13 0942     Assessment: 36 YOF found down 4/11 with large ICH with severe surrounding edema on Unasyn day # 3 for presumed aspiration pneumonia. Patient remains  febrile (bleed + r/o infection) and WBC is rising. LA was elevated at 2.66 on admission. No cultures are available. SCr is stable with estimated CrCl ~55-23mL/min. UOP estimated ~0.6 cc/kg/hr.   Goal of Therapy:  Clinical resolution of infection  Plan:  Increase Unasyn to 3g IV q6h due to persistent leukocytosis and fever (realize this could all be bleed related).   Sloan Leiter, PharmD, BCPS Clinical Pharmacist [phone] 09/20/2013,9:42 AM

## 2013-09-20 NOTE — Progress Notes (Signed)
Stroke Team Progress Note  HISTORY  Andrea Fox is an 80 y.o. female who lives alone. Patient is unable to provide any history. Went out to dinner with her family last evening and was doing fine. It seems that she was able to get up this morning and get dressed. Was found on the ground outside by a passerby. EMS was called. Patient remained unresponsive on presentation. Was intubated in the ED and started on Propofol.  Date last known well: Date: 09/17/2013  Time last known well: Time: 21:00  tPA Given: No: ICH   SUBJECTIVE No family is at bedside. The patient is intubated, comatose.  OBJECTIVE Most recent Vital Signs: Filed Vitals:   09/20/13 0159 09/20/13 0300 09/20/13 0400 09/20/13 0600  BP:  159/65 167/68 167/67  Pulse:  72 69 72  Temp:    97.9 F (36.6 C)  TempSrc:      Resp:  12 13 13   Height:      Weight: 175 lb 4.3 oz (79.5 kg)     SpO2:  99% 99% 97%   CBG (last 3)   Recent Labs  10-03-13 0755 09/19/13 0619  GLUCAP 161* 128*    IV Fluid Intake:   . sodium chloride (hypertonic) 75 mL/hr at 10-03-2013 2208    MEDICATIONS  . ampicillin-sulbactam (UNASYN) IV  1.5 g Intravenous Q8H  . antiseptic oral rinse  15 mL Mouth Rinse q12n4p  . chlorhexidine  15 mL Mouth Rinse BID  . famotidine (PEPCID) IV  20 mg Intravenous Q12H  . potassium chloride  10 mEq Intravenous Q1 Hr x 4   PRN:  sodium chloride, acetaminophen (TYLENOL) oral liquid 160 mg/5 mL, hydrALAZINE, labetalol  Diet:  NPO  Activity:  Bedrest DVT Prophylaxis:  SCD  CLINICALLY SIGNIFICANT STUDIES Basic Metabolic Panel:  Recent Labs Lab 09/19/13 0900  09/20/13 0145 09/20/13 0424  NA 158*  < > 159* 157*  K 3.5*  --   --  3.1*  CL 119*  --   --  120*  CO2 23  --   --  23  GLUCOSE 122*  --   --  127*  BUN 11  --   --  12  CREATININE 0.65  --   --  0.58  CALCIUM 9.1  --   --  9.8  < > = values in this interval not displayed. Liver Function Tests:   Recent Labs Lab 2013-10-03 0755  AST 18  ALT  14  ALKPHOS 73  BILITOT 0.5  PROT 7.3  ALBUMIN 4.0   CBC:   Recent Labs Lab 10-03-13 0755 09/19/13 0900 09/20/13 0424  WBC 14.2* 12.6* 14.9*  NEUTROABS 9.6*  --  12.4*  HGB 10.8* 8.9* 9.7*  HCT 34.2* 28.6* 30.9*  MCV 75.8* 75.7* 75.9*  PLT 218 172 154   Coagulation:   Recent Labs Lab 2013/10/03 0755  LABPROT 12.7  INR 0.97   Cardiac Enzymes:   Recent Labs Lab 2013/10/03 1059 10-03-13 1525 10/03/2013 2057  TROPONINI <0.30 <0.30 <0.30   Urinalysis:   Recent Labs Lab 03-Oct-2013 0757  COLORURINE AMBER*  LABSPEC 1.030  PHURINE 5.5  GLUCOSEU NEGATIVE  HGBUR NEGATIVE  BILIRUBINUR NEGATIVE  KETONESUR NEGATIVE  PROTEINUR >300*  UROBILINOGEN 0.2  NITRITE NEGATIVE  LEUKOCYTESUR NEGATIVE   Lipid Panel    Component Value Date/Time   TRIG 91 07/28/2013 1514   HDL 55 07/28/2013 1514   CHOLHDL 3.1 07/28/2013 1514   LDLCALC 97 07/28/2013 1514   HgbA1C  No  results found for this basename: HGBA1C    Urine Drug Screen:   No results found for this basename: labopia,  cocainscrnur,  labbenz,  amphetmu,  thcu,  labbarb    Alcohol Level: No results found for this basename: ETH,  in the last 168 hours  Ct Head Wo Contrast  09/20/2013   CLINICAL DATA:  Follow-up hemorrhagic stroke  EXAM: CT HEAD WITHOUT CONTRAST  TECHNIQUE: Contiguous axial images were obtained from the base of the skull through the vertex without intravenous contrast.  COMPARISON:  CT HEAD W/O CM dated 09/19/2013; CT HEAD W/O CM dated 09/24/2013; CT C SPINE W/O CM dated 09/15/2013  FINDINGS: Stable large left parietal with marked amount of surrounding vasogenic edema involving left temporal lobe, posterior left frontal lobe, left parietal lobe and left occipital lobe. Stable moderate amount of hemorrhage within the right lateral ventricle.  There is stable left-to-right midline shift measuring 18 mm. There is complete effacement of the occipital horn of the left lateral ventricle. Again noted is abnormal appearance of  the brainstem which may indicate infarct or extension of vasogenic edema from the intracranial hemorrhage.  There is loss of the normal sulci and basal cisterns consistent with uncal and downward herniation manifested by crowding of the foramen magnum and low-lying cerebellar tonsils. The overall appearance is unchanged from the prior exam.  There is a stable 5 mm left subdural hematoma.  The calvarium is intact.  There is a right maxillary sinus and bilateral ethmoid sinus mucosal thickening.  IMPRESSION: 1. Stable large left parietal lobe intracranial hemorrhage with severe surrounding vasogenic edema. There is stable intraventricular hemorrhage. There is stable left-to-right midline shift. There is stable abnormal appearance of the brainstem which may indicate extension of infarction versus vasogenic edema. 2. There is stable loss of sulcation and the basal cisterns consistent with increased intracranial pressure with crowding of the foramen magnum and low lying cerebellar tonsils. 3. Stable 5 mm left subdural hematoma.   Electronically Signed   By: Kathreen Devoid   On: 09/20/2013 02:50   Ct Head Wo Contrast  09/19/2013   CLINICAL DATA:  Unresponsive.  Intracranial hemorrhage.  EXAM: CT HEAD WITHOUT CONTRAST  TECHNIQUE: Contiguous axial images were obtained from the base of the skull through the vertex without intravenous contrast.  COMPARISON:  09/21/2013 CT.  FINDINGS: Interval increase in size of large left hemispheric intracranial hemorrhage centered in the left parietal region with marked amount of surrounding vasogenic edema involving left temporal lobe, posterior left frontal lobe, left parietal lobe and left occipital lobe.  Breakthrough of hemorrhage into the ventricle with moderate amount of blood within the right lateral ventricle.  Midline shift to the right by 1.8 cm relatively similar to prior exam.  Trapping of the right lateral ventricle with dilated the right lateral ventricle.  Abnormal  appearance of the basal ganglia/brainstem may indicate infarct and/or extension or edema from intracranial hemorrhage.  Marked loss all of sulci and cisterns consistent with uncal and downward herniation (low lying cerebellar tonsils with crowding of the foramen magnum).  Left subdural hematoma measures up to 6.8 mm maximal thickness without significant change.  No skull fracture detected.  Mucosal thickening paranasal sinuses.  IMPRESSION: Interval increase in size of large left hemispheric intracranial hemorrhage centered in the left parietal region with marked amount of surrounding vasogenic edema involving left temporal lobe, posterior left frontal lobe, left parietal lobe and left occipital lobe.  Breakthrough of hemorrhage into the ventricle with moderate amount of blood  within the right lateral ventricle.  Midline shift to the right by 1.8 cm relatively similar to prior exam.  Trapping of the right lateral ventricle with dilated the right lateral ventricle.  Abnormal appearance of the basal ganglia/brainstem may indicate infarct and/or extension or edema from intracranial hemorrhage.  Marked loss all of sulci and cisterns consistent with uncal and downward herniation (low lying cerebellar tonsils with crowding of the foramen magnum).  Left subdural hematoma measures up to 6.8 mm maximal thickness without significant change.  These results were called by telephone at the time of interpretation on 09/19/2013 at 9:05 AM to [place] patients nurse, who verbally acknowledged these results. Request to call physician on-call with results.   Electronically Signed   By: Chauncey Cruel M.D.   On: 09/19/2013 09:10   Ct Head Wo Contrast  09/10/2013   CLINICAL DATA:  Found unresponsive  EXAM: CT HEAD WITHOUT CONTRAST  CT CERVICAL SPINE WITHOUT CONTRAST  TECHNIQUE: Multidetector CT imaging of the head and cervical spine was performed following the standard protocol without intravenous contrast. Multiplanar CT image  reconstructions of the cervical spine were also generated.  COMPARISON:  None.  FINDINGS: CT HEAD FINDINGS  There is a large (at least 7.4 x 4.1 cm) intraparenchymal hemorrhage centered primarily within the posterior aspect of the left parietal occipital lobe (image 17, series 2) which is associated with mass effect and near complete effacement of the left lateral ventricle and approximately 1.5 cm of left to right midline shift and uncal herniation. This intraparenchymal hemorrhage is associated with an approximately 0.9 cm left-sided crescentic subdural hematoma.  There is an additional approximately 1.0 x 1.5 cm intraparenchymal hemorrhage within the subcortical aspect of the contralateral right frontal lobe (image 16, series 2). No definite intraventricular hemorrhage.  The patient is intubated and has a nasogastric tube. There are nonspecific air-fluid levels within the right maxillary sinus and the sphenoid sinus. Minimal polypoid mucosal thickening within the bilateral maxillary sinuses. There is minimal mucosal thickening within the bilateral posterior ethmoidal air cells. Normal appearance of the bilateral frontal sinuses and mastoid air cells.  Regional soft tissues are normal. No displaced calvarial fracture. No radiopaque foreign body.  CT CERVICAL SPINE FINDINGS  C1 to the superior endplate of T1 is imaged.  Normal alignment of the cervical spine. No anterolisthesis or retrolisthesis. The dens is normally positioned between the lateral masses of C1. Normal atlantodental and atlantoaxial articulations. The bilateral facets are normally aligned.  No fracture or static subluxation of the cervical spine. Cervical vertebral body heights are preserved. Prevertebral soft tissues are normal.  There is mild-to-moderate multilevel cervical spine DDD, likely worse at C3-C4 and C6-C7 with disc space height loss, endplate irregularity and small posteriorly directed disc osteophyte complexes at these locations.  Scattered shotty bilateral cervical lymph nodes individually not enlarged by size criteria on this noncontrast examination. There is heterogeneous appearance of the thyroid gland without discrete nodule on this noncontrast evaluation.  Interval development of a consolidative airspace opacity within the imaged left lung apex, not seen on chest radiograph performed earlier same day.  IMPRESSION: 1. Large (at least 7.4 cm) intraparenchymal hemorrhage centered primarily within the left posterior parietal occipital lobe with associated small to moderate-sized left-sided subdural hematoma and mass effect with approximately 1.5 cm of left-to-right midline shift and uncal herniation. The dominant hemorrhage within the posterior parietal lobe is associated with a smaller (approximately 1.5 cm) intraparenchymal hemorrhage within the subcortical aspect of the contralateral right frontal lobe -  constellation of findings are suggestive of recent fall with contrecoup type injury. Clinical correlation is advised. No displaced calvarial fracture. 2. No fracture or static subluxation of the cervical spine. 3. Interval development of a consolidative airspace opacity within the imaged left lung apex, not seen on chest radiograph performed earlier same day, suggestive of aspiration.  Critical Value/emergent results were called by telephone at the time of interpretation on 09/22/2013 at 8:33 AM to Dr. Serita Grit , who verbally acknowledged these results.   Electronically Signed   By: Sandi Mariscal M.D.   On: 09/09/2013 08:46   Ct Cervical Spine Wo Contrast  09/08/2013   CLINICAL DATA:  Found unresponsive  EXAM: CT HEAD WITHOUT CONTRAST  CT CERVICAL SPINE WITHOUT CONTRAST  TECHNIQUE: Multidetector CT imaging of the head and cervical spine was performed following the standard protocol without intravenous contrast. Multiplanar CT image reconstructions of the cervical spine were also generated.  COMPARISON:  None.  FINDINGS: CT HEAD  FINDINGS  There is a large (at least 7.4 x 4.1 cm) intraparenchymal hemorrhage centered primarily within the posterior aspect of the left parietal occipital lobe (image 17, series 2) which is associated with mass effect and near complete effacement of the left lateral ventricle and approximately 1.5 cm of left to right midline shift and uncal herniation. This intraparenchymal hemorrhage is associated with an approximately 0.9 cm left-sided crescentic subdural hematoma.  There is an additional approximately 1.0 x 1.5 cm intraparenchymal hemorrhage within the subcortical aspect of the contralateral right frontal lobe (image 16, series 2). No definite intraventricular hemorrhage.  The patient is intubated and has a nasogastric tube. There are nonspecific air-fluid levels within the right maxillary sinus and the sphenoid sinus. Minimal polypoid mucosal thickening within the bilateral maxillary sinuses. There is minimal mucosal thickening within the bilateral posterior ethmoidal air cells. Normal appearance of the bilateral frontal sinuses and mastoid air cells.  Regional soft tissues are normal. No displaced calvarial fracture. No radiopaque foreign body.  CT CERVICAL SPINE FINDINGS  C1 to the superior endplate of T1 is imaged.  Normal alignment of the cervical spine. No anterolisthesis or retrolisthesis. The dens is normally positioned between the lateral masses of C1. Normal atlantodental and atlantoaxial articulations. The bilateral facets are normally aligned.  No fracture or static subluxation of the cervical spine. Cervical vertebral body heights are preserved. Prevertebral soft tissues are normal.  There is mild-to-moderate multilevel cervical spine DDD, likely worse at C3-C4 and C6-C7 with disc space height loss, endplate irregularity and small posteriorly directed disc osteophyte complexes at these locations. Scattered shotty bilateral cervical lymph nodes individually not enlarged by size criteria on this  noncontrast examination. There is heterogeneous appearance of the thyroid gland without discrete nodule on this noncontrast evaluation.  Interval development of a consolidative airspace opacity within the imaged left lung apex, not seen on chest radiograph performed earlier same day.  IMPRESSION: 1. Large (at least 7.4 cm) intraparenchymal hemorrhage centered primarily within the left posterior parietal occipital lobe with associated small to moderate-sized left-sided subdural hematoma and mass effect with approximately 1.5 cm of left-to-right midline shift and uncal herniation. The dominant hemorrhage within the posterior parietal lobe is associated with a smaller (approximately 1.5 cm) intraparenchymal hemorrhage within the subcortical aspect of the contralateral right frontal lobe - constellation of findings are suggestive of recent fall with contrecoup type injury. Clinical correlation is advised. No displaced calvarial fracture. 2. No fracture or static subluxation of the cervical spine. 3. Interval development of a  consolidative airspace opacity within the imaged left lung apex, not seen on chest radiograph performed earlier same day, suggestive of aspiration.  Critical Value/emergent results were called by telephone at the time of interpretation on 09/22/2013 at 8:33 AM to Dr. Serita Grit , who verbally acknowledged these results.   Electronically Signed   By: Sandi Mariscal M.D.   On: 09/22/2013 08:46   Dg Chest Port 1 View  09/19/2013   CLINICAL DATA:  Aspiration  EXAM: PORTABLE CHEST - 1 VIEW  COMPARISON:  09/12/2013  FINDINGS: Endotracheal tube is in satisfactory position, 4 cm above the carina. Left IJ central venous catheter has been placed, and terminates in the distal superior vena cava, in satisfactory position. Negative for pneumothorax. Heart size appears mildly enlarged and stable. Slight blunting of left costophrenic angle may be due to focal atelectasis or tiny pleural effusion. The remainder of  the left lung is clear. Right lung is clear. No acute osseous abnormality identified.  IMPRESSION: 1. Satisfactory position of left IJ central venous catheter. No evidence of pneumothorax. 2. Tiny opacity left costophrenic angle could reflect atelectasis or very small pleural effusion.   Electronically Signed   By: Curlene Dolphin M.D.   On: 09/19/2013 09:05   Dg Chest Port 1 View  09/22/2013   CLINICAL DATA:  Central line placement  EXAM: PORTABLE CHEST - 1 VIEW  COMPARISON:  DG CHEST 1V PORT dated 09/16/2013  FINDINGS: Grossly unchanged cardiac silhouette and mediastinal contours given reduced lung volumes and rotation. Interval placement of a left jugular approach intravenous catheter with tip overlying superior cavoatrial junction. Interval placement of enteric tube with tip and side port projecting below left hemidiaphragm. Otherwise, stable positioning of remaining support apparatus. No pneumothorax. Grossly unchanged bilateral infrahilar opacities, right greater than left. No new focal airspace opacities. No pleural effusion or pneumothorax. No evidence of edema. No acute osseus abnormalities.  IMPRESSION: 1. Interval placement of left jugular approach intravenous catheter with tip projected over the superior cavoatrial junction. No pneumothorax. 2. Interval placement of enteric tube with tip and side port projecting below the left hemidiaphragm. 3. Decreased lung volumes with unchanged bilateral medial basilar opacities, likely atelectasis.   Electronically Signed   By: Sandi Mariscal M.D.   On: 09/16/2013 14:12    CT of the brain   IMPRESSION:  1. Large (at least 7.4 cm) intraparenchymal hemorrhage centered  primarily within the left posterior parietal occipital lobe with  associated small to moderate-sized left-sided subdural hematoma and  mass effect with approximately 1.5 cm of left-to-right midline shift  and uncal herniation. The dominant hemorrhage within the posterior  parietal lobe is  associated with a smaller (approximately 1.5 cm)  intraparenchymal hemorrhage within the subcortical aspect of the  contralateral right frontal lobe - constellation of findings are  suggestive of recent fall with contrecoup type injury. Clinical  correlation is advised. No displaced calvarial fracture.  2. No fracture or static subluxation of the cervical spine.  3. Interval development of a consolidative airspace opacity within  the imaged left lung apex, not seen on chest radiograph performed  earlier same day, suggestive of aspiration.  CT brain 09/20/2013:  IMPRESSION:  1. Stable large left parietal lobe intracranial hemorrhage with  severe surrounding vasogenic edema. There is stable intraventricular  hemorrhage. There is stable left-to-right midline shift. There is  stable abnormal appearance of the brainstem which may indicate  extension of infarction versus vasogenic edema.  2. There is stable loss of sulcation and  the basal cisterns  consistent with increased intracranial pressure with crowding of the  foramen magnum and low lying cerebellar tonsils.  3. Stable 5 mm left subdural hematoma.    MRI of the brain    MRA of the brain    2D Echocardiogram    Carotid Doppler    CXR   IMPRESSION:  1. Endotracheal tube well positioned with its tip 3.2 cm above the  carina.  2. No acute cardiopulmonary disease.  EKG    Sinus rhythm IVCD, consider atypical RBBB Abnormal T, consider ischemia, lateral leads  Therapy Recommendations Pending  Physical Exam  General: The patient is comatose, intubated.  Cardiovascular: Regular rate and rhythm, no obvious murmurs.  Respiratory: Lung fields are clear  Abdomen: Soft, nontender, minimal bowel sounds.  Skin: No significant peripheral edema is noted.   Neurologic Exam  Mental status: The patient is intubated, comatose  Cranial nerves: Facial symmetry is present. Pupils are mid position, under reactive. No doll's. No  corneal on the right, trace on the left.  Motor: The patient has decreased motor tone of all 4 extremities.  Sensory examination: With sternal rub, there is no response. No response was seen in the deep pain stimulation on all 4 extremities.  Coordination: The patient could not cooperate for cerebellar testing.  Gait and station: The patient could not be ambulated.  Reflexes: Deep tendon reflexes are symmetric, depressed to absent throughout.    ASSESSMENT Andrea Fox is a 80 y.o. female presenting with left posterior cerebral artery distribution stroke, hemorrhagic conversion associated with subdural hematoma. The patient appears to have malignant edema associated with this infarct with significant shift from left to right.   Left posterior cerebral artery stroke, hemorrhagic conversion  Hypertension  Dyslipidemia  Hospital day # 2  Clinical examination today shows minimal responsiveness on the part the patient, the patient has an examination that is very close to brain death at this time. The patient has trace corneal reflex on the left, minimal gag. Otherwise, no response is seen. I would expect that the patient would progress on to brain death within the next 24 hours. I will discontinue the hypertonic saline.  I had a long discussion with the family yesterday, the family wants full measures taken, full code. I indicated that the patient may progress on to brain death, and described the meaning of this diagnosis.  TREATMENT/PLAN  No antiplatelet agents  Supportive care, intubation, critical care is following and managing  Hypertonic saline therapy to be discontinued  patient currently is full code  Kathrynn Ducking  09/20/2013 7:55 AM

## 2013-09-21 ENCOUNTER — Inpatient Hospital Stay (HOSPITAL_COMMUNITY): Payer: Medicare Other

## 2013-09-21 DIAGNOSIS — I10 Essential (primary) hypertension: Secondary | ICD-10-CM

## 2013-09-21 LAB — BASIC METABOLIC PANEL
BUN: 15 mg/dL (ref 6–23)
CALCIUM: 9.1 mg/dL (ref 8.4–10.5)
CO2: 22 meq/L (ref 19–32)
CREATININE: 0.77 mg/dL (ref 0.50–1.10)
Chloride: 126 mEq/L — ABNORMAL HIGH (ref 96–112)
GFR calc non Af Amer: 78 mL/min — ABNORMAL LOW (ref >90)
Glucose, Bld: 114 mg/dL — ABNORMAL HIGH (ref 70–99)
Potassium: 3.1 mEq/L — ABNORMAL LOW (ref 3.7–5.3)
Sodium: 161 mEq/L — ABNORMAL HIGH (ref 137–147)

## 2013-09-21 LAB — CBC
HCT: 28 % — ABNORMAL LOW (ref 36.0–46.0)
Hemoglobin: 8.8 g/dL — ABNORMAL LOW (ref 12.0–15.0)
MCH: 23.7 pg — ABNORMAL LOW (ref 26.0–34.0)
MCHC: 31.4 g/dL (ref 30.0–36.0)
MCV: 75.3 fL — AB (ref 78.0–100.0)
PLATELETS: 153 10*3/uL (ref 150–400)
RBC: 3.72 MIL/uL — ABNORMAL LOW (ref 3.87–5.11)
RDW: 15.7 % — AB (ref 11.5–15.5)
WBC: 10.7 10*3/uL — ABNORMAL HIGH (ref 4.0–10.5)

## 2013-09-21 MED ORDER — POTASSIUM CHLORIDE 10 MEQ/50ML IV SOLN
10.0000 meq | INTRAVENOUS | Status: AC
  Administered 2013-09-21 (×4): 10 meq via INTRAVENOUS
  Filled 2013-09-21 (×4): qty 50

## 2013-09-21 MED ORDER — SODIUM CHLORIDE 0.9 % IV BOLUS (SEPSIS)
1000.0000 mL | Freq: Once | INTRAVENOUS | Status: AC
  Administered 2013-09-21: 1000 mL via INTRAVENOUS

## 2013-09-21 NOTE — Progress Notes (Signed)
NUTRITION FOLLOW-UP  INTERVENTION: If indicated recommend: Initiate Vital High Protein @ 25 ml/hr and increase by 10 ml every 4 hours to goal rate of 45 ml/hr.   Tube feeding regimen provides 1080 kcal, 94 grams of protein, and 902 ml of H2O.    NUTRITION DIAGNOSIS: Inadequate oral intake related to inability to eat as evidenced by NPO status; ongoing  Goal: Pt to meet >/= 90% of their estimated nutrition needs, not met.   Monitor:  Plan of care   ASSESSMENT: Patient is currently intubated on ventilator support due to left PCA infarct with hemorrhage. Per MD note not surgical candidate and likely close to herniation. Family wants aggressive care.  Pt discussed during ICU rounds and with RN. Per MD note exam consistent with brain death, apnea test ordered.  MV: 7.6 L/min Temp (24hrs), Avg:97.6 F (36.4 C), Min:88.3 F (31.3 C), Max:99.9 F (37.7 C)  Potassium low.  Height: Ht Readings from Last 1 Encounters:  09/25/2013 _0  (1.778 m)    Weight: Wt Readings from Last 1 Encounters:  09/20/13 175 lb 4.3 oz (79.5 kg)  Admission weight 180 lb (81.6 kg) 4/11  BMI:  Body mass index is 25.15 kg/(m^2).  Estimated Nutritional Needs: Kcal: 1099 Protein: 97-110 grams Fluid: >1.5 L/day  Skin: no issues noted  Diet Order:     Intake/Output Summary (Last 24 hours) at 09/21/13 1102 Last data filed at 09/21/13 0800  Gross per 24 hour  Intake 2824.17 ml  Output   1310 ml  Net 1514.17 ml    Last BM: 4/11  Labs:   Recent Labs Lab 09/19/13 0900  09/20/13 0145 09/20/13 0424 09/21/13 0300  NA 158*  < > 159* 157* 161*  K 3.5*  --   --  3.1* 3.1*  CL 119*  --   --  120* 126*  CO2 23  --   --  23 22  BUN 11  --   --  12 15  CREATININE 0.65  --   --  0.58 0.77  CALCIUM 9.1  --   --  9.8 9.1  GLUCOSE 122*  --   --  127* 114*  < > = values in this interval not displayed.  CBG (last 3)   Recent Labs  09/19/13 0619  GLUCAP 128*    Scheduled Meds: .  antiseptic oral rinse  15 mL Mouth Rinse q12n4p  . chlorhexidine  15 mL Mouth Rinse BID  . famotidine  20 mg Per Tube BID    Continuous Infusions: . sodium chloride 50 mL/hr (09/21/13 0254)     [place], [place], CNSC [phone] Pager [phone] After Hours Pager

## 2013-09-21 NOTE — Progress Notes (Signed)
PULMONARY / CRITICAL CARE MEDICINE   Name: Allison Deshotels MRN: [id number] DOB: [birth year]    ADMISSION DATE:  09/25/2013  REFERRING MD :  Trenton Gammon PRIMARY SERVICE: PCCM  CHIEF COMPLAINT:  Stroke, ICH  BRIEF PATIENT DESCRIPTION:  80 yo female with hx HTN found down 4/11.  Intubated in ER for airway protection.  CT head revealed large hemorrhage with mass effect.  Seen by nsgy, no surgical intervention planned. PCCM called to admit.   SIGNIFICANT EVENTS / STUDIES:  4/11 NS Consult: Left posterior cerebral artery infarction with secondary hemorrhage. Patient with evidence of lower brainstem function only. No indication for surgical intervention. I recommend critical care admission and stroke team care.  4/11 Stroke Team Consult: Poor prognosis conveyed to family. Hypertonic saline protocol initiated 4/11 CT head:  large intraparenchymal L post parietal/occipital hemorrhage with associated small to moderate-sized left-sided subdural hematoma and mass effect with approximately 1.5 cm of left-to-right midline shift and uncal herniation.  4/12 CT head:  Interval increase in size of large left hemispheric intracranial hemorrhage centered in the left parietal region with marked amount of surrounding vasogenic edema involving left temporal lobe, posterior left frontal lobe, left parietal lobe and left occipital lobe. Breakthrough of hemorrhage into the ventricle with moderate amount of blood within the right lateral ventricle. Midline shift to the right by 1.8 cm relatively similar to prior exam. Trapping of the right lateral ventricle with dilated the right lateral ventricle. Abnormal appearance of the basal ganglia/brainstem may indicate infarct and/or extension or edema from intracranial hemorrhage.  Marked loss all of sulci and cisterns consistent with uncal and downward herniation (low lying cerebellar tonsils with crowding of  the foramen magnum). Left subdural hematoma measures up to 6.8 mm maximal  thickness without significant change. 4/13 CT head: 1. Stable large left parietal lobe intracranial hemorrhage with severe surrounding vasogenic edema. There is stable intraventricular hemorrhage. There is stable left-to-right midline shift. There is stable abnormal appearance of the brainstem which may indicate extension of infarction versus vasogenic edema. There is stable loss of sulcation and the basal cisterns consistent with increased intracranial pressure with crowding of the foramen magnum and low lying cerebellar tonsils. Stable 5 mm left subdural hematoma. 4/13: Remains comatose with fixed pupils. Spontaneous respiratory effort present. Hypertonic saline discontinued 4/13 PM: Tachycardia and severe hypertension > nicardipine initiated.  2022/09/30:  Exam c/w brain death. Apnea test ordered  LINES / TUBES: ETT 4/11 >>  L IJ CVL 4/11 >>   CULTURES:   ANTIBIOTICS: Unasyn 4/11 >> Sep 30, 2022  INTERVAL HX:   Remains comatose with fixed pupils. No spontaneous respiratory effort present   VITAL SIGNS: Temp:  [88.3 F (31.3 C)-99.9 F (37.7 C)] 97.3 F (36.3 C) 09/30/2022 0355) Pulse Rate:  [69-124] 71 09/30/2022 0930) Resp:  [0-24] 14 09-30-22 0930) BP: (72-203)/(46-98) 120/57 mmHg Sep 30, 2022 0930) SpO2:  [93 %-100 %] 97 % 2022-09-30 0930) FiO2 (%):  [30 %] 30 % 30-Sep-2022 0738) HEMODYNAMICS:   VENTILATOR SETTINGS: Vent Mode:  [-] PRVC FiO2 (%):  [30 %] 30 % Set Rate:  [14 bmp-140 bmp] 140 bmp Vt Set:  [550 mL] 550 mL PEEP:  [5 cmH20] 5 cmH20 Pressure Support:  [10 cmH20] 10 cmH20 Plateau Pressure:  [16 cmH20-18 cmH20] 16 cmH20 INTAKE / OUTPUT: Intake/Output     04/13 0701 - 30-Sep-2022 0700 September 30, 2022 0701 - 04/15 0700   I.V. (mL/kg) 1224.2 (15.4) 50 (0.6)   Other 200    IV Piggyback 1450    Total Intake(mL/kg) 2874.2 (  36.2) 50 (0.6)   Urine (mL/kg/hr) 1060 (0.6) 250 (1)   Emesis/NG output 100 (0.1)    Total Output 1160 250   Net +1714.2 -200          PHYSICAL EXAMINATION: General: Comatose Neuro:  Exam  c/w brain death HEENT: [place] Cardiovascular: regular, no M Lungs: clear anteriorly  Abdomen:  Soft, +BS Ext: Warm and dry, no edema   LABS: I have reviewed all of today's lab results. Relevant abnormalities are discussed in the A/P section  CXR: Increasing bibasilar opacities, likely atelectasis  ASSESSMENT / PLAN:  NEUROLOGIC Massive L post cerebral artery CVA ICH due to hemorrhagic transformation Cerebral edema Suspect brain death P:   Further eval and mgmt per Stroke team Apnea test planned today  PULMONARY Acute respiratory failure due to AMS Doubt aspiration  P:   Wean as tolerated in PSV mode Cont vent bundle Tracheostomy tube not an option  DC abx  CARDIOVASCULAR HTN P:  BP management   RENAL Hypokalemia  Hypernatremia P:   Monitor BMET intermittently Monitor I/Os Correct electrolytes as indicated Allow auto-correction of Na  GASTROINTESTINAL No active issue  P:   Cont SUP Cont TFs  HEMATOLOGIC No issues P:  DVT px: SCD's  Monitor CBC intermittently  INFECTIOUS Fever - likely central Doubt aspiration  Leukocytosis - mild  P:   Micro and abx as above  ENDOCRINE Hyperglycemia - mild  P:   Cont SSI  Daughters updated from PCCM perspective CCM time: 30 mins  Merton Border, MD ; [facility] service Mobile [phone].  After 5:30 PM or weekends, call [phone]

## 2013-09-21 NOTE — Progress Notes (Signed)
[facility] ADULT ICU REPLACEMENT PROTOCOL FOR AM LAB REPLACEMENT ONLY  The patient does apply for the [facility] Adult ICU Electrolyte Replacment Protocol based on the criteria listed below:   1. Is GFR >/= 40 ml/min? yes  Patient's GFR today is 78 2. Is urine output >/= 0.5 ml/kg/hr for the last 6 hours? yes Patient's UOP is 1.04 ml/kg/hr 3. Is BUN < 60 mg/dL? yes  Patient's BUN today is 15 4. Abnormal electrolyte K 3.1 5. Ordered repletion with: per protocol 6. If a panic level lab has been reported, has the CCM MD in charge been notified? yes.   Physician:  Lutricia Feil Daisi Kentner 09/21/2013 4:07 AM

## 2013-09-21 NOTE — Progress Notes (Addendum)
Stroke Team Progress Note  HISTORY  Andrea Fox is an 80 y.o. female who lives alone. Patient is unable to provide any history. Went out to dinner with her family last evening and was doing fine. It seems that she was able to get up this morning and get dressed. Was found on the ground outside by a passerby. EMS was called. Patient remained unresponsive on presentation. Was intubated in the ED and started on Propofol.  Date last known well: Date: 09/17/2013  Time last known well: Time: 21:00  tPA Given: No: ICH   SUBJECTIVE Two daughters are present this morning. The patient remains on a ventilator. She is essentially unresponsive. Dr. Leonie Man had a long talk with the daughters regarding the poor prognosis. Nursing reports the patient no longer has a gag reflex. Physical exam shows no response to sternal rub - pupils are nonreactive. Dr. Leonie Man recommended an apnea test for evaluation of brain death.  The daughters want to wait for an out-of-town family member to arrive before making any decisions and before any further testing.  OBJECTIVE Most recent Vital Signs: Filed Vitals:   09/21/13 0500 09/21/13 0600 09/21/13 0700 09/21/13 0738  BP: 92/50 97/48 98/58  95/57  Pulse: 73 73 73 73  Temp:      TempSrc:      Resp: 14 14 14 14   Height:      Weight:      SpO2: 96% 96% 98% 100%   CBG (last 3)   Recent Labs  09/19/13 0619  GLUCAP 128*    IV Fluid Intake:   . sodium chloride 50 mL/hr (09/21/13 0254)  . niCARDipine Stopped (09/21/13 0009)    MEDICATIONS  . ampicillin-sulbactam (UNASYN) IV  3 g Intravenous Q6H  . antiseptic oral rinse  15 mL Mouth Rinse q12n4p  . chlorhexidine  15 mL Mouth Rinse BID  . famotidine  20 mg Per Tube BID  . potassium chloride  10 mEq Intravenous Q1 Hr x 4   PRN:  acetaminophen (TYLENOL) oral liquid 160 mg/5 mL, hydrALAZINE, labetalol  Diet:     Activity:  Bedrest DVT Prophylaxis:  SCD  CLINICALLY SIGNIFICANT STUDIES Basic Metabolic Panel:    Recent Labs Lab 09/20/13 0424 09/21/13 0300  NA 157* 161*  K 3.1* 3.1*  CL 120* 126*  CO2 23 22  GLUCOSE 127* 114*  BUN 12 15  CREATININE 0.58 0.77  CALCIUM 9.8 9.1   Liver Function Tests:   Recent Labs Lab 09/25/2013 0755  AST 18  ALT 14  ALKPHOS 73  BILITOT 0.5  PROT 7.3  ALBUMIN 4.0   CBC:   Recent Labs Lab 09/22/2013 0755  09/20/13 0424 09/21/13 0300  WBC 14.2*  < > 14.9* 10.7*  NEUTROABS 9.6*  --  12.4*  --   HGB 10.8*  < > 9.7* 8.8*  HCT 34.2*  < > 30.9* 28.0*  MCV 75.8*  < > 75.9* 75.3*  PLT 218  < > 154 153  < > = values in this interval not displayed. Coagulation:   Recent Labs Lab 10/01/2013 0755  LABPROT 12.7  INR 0.97   Cardiac Enzymes:   Recent Labs Lab 09/17/2013 1059 09/22/2013 1525 09/14/2013 2057  TROPONINI <0.30 <0.30 <0.30   Urinalysis:   Recent Labs Lab 09/20/2013 0757  COLORURINE AMBER*  LABSPEC 1.030  PHURINE 5.5  GLUCOSEU NEGATIVE  HGBUR NEGATIVE  BILIRUBINUR NEGATIVE  KETONESUR NEGATIVE  PROTEINUR >300*  UROBILINOGEN 0.2  NITRITE NEGATIVE  LEUKOCYTESUR NEGATIVE   Lipid  Panel    Component Value Date/Time   TRIG 91 07/28/2013 1514   HDL 55 07/28/2013 1514   CHOLHDL 3.1 07/28/2013 1514   LDLCALC 97 07/28/2013 1514   HgbA1C  No results found for this basename: HGBA1C    Urine Drug Screen:   No results found for this basename: labopia,  cocainscrnur,  labbenz,  amphetmu,  thcu,  labbarb    Alcohol Level: No results found for this basename: ETH,  in the last 168 hours  Ct Head Wo Contrast 09/20/2013    1. Stable large left parietal lobe intracranial hemorrhage with severe surrounding vasogenic edema. There is stable intraventricular hemorrhage. There is stable left-to-right midline shift. There is stable abnormal appearance of the brainstem which may indicate extension of infarction versus vasogenic edema. 2. There is stable loss of sulcation and the basal cisterns consistent with increased intracranial pressure with  crowding of the foramen magnum and low lying cerebellar tonsils. 3. Stable 5 mm left subdural hematoma.      Ct Head Wo Contrast 09/19/2013    Interval increase in size of large left hemispheric intracranial hemorrhage centered in the left parietal region with marked amount of surrounding vasogenic edema involving left temporal lobe, posterior left frontal lobe, left parietal lobe and left occipital lobe.  Breakthrough of hemorrhage into the ventricle with moderate amount of blood within the right lateral ventricle.  Midline shift to the right by 1.8 cm relatively similar to prior exam.  Trapping of the right lateral ventricle with dilated the right lateral ventricle.  Abnormal appearance of the basal ganglia/brainstem may indicate infarct and/or extension or edema from intracranial hemorrhage.  Marked loss all of sulci and cisterns consistent with uncal and downward herniation (low lying cerebellar tonsils with crowding of the foramen magnum).  Left subdural hematoma measures up to 6.8 mm maximal thickness without significant change.    CT of the brain   IMPRESSION:  1. Large (at least 7.4 cm) intraparenchymal hemorrhage centered  primarily within the left posterior parietal occipital lobe with  associated small to moderate-sized left-sided subdural hematoma and  mass effect with approximately 1.5 cm of left-to-right midline shift  and uncal herniation. The dominant hemorrhage within the posterior  parietal lobe is associated with a smaller (approximately 1.5 cm)  intraparenchymal hemorrhage within the subcortical aspect of the  contralateral right frontal lobe - constellation of findings are  suggestive of recent fall with contrecoup type injury. Clinical  correlation is advised. No displaced calvarial fracture.  2. No fracture or static subluxation of the cervical spine.  3. Interval development of a consolidative airspace opacity within  the imaged left lung apex, not seen on chest  radiograph performed  earlier same day, suggestive of aspiration.  CT brain 09/20/2013:  IMPRESSION:  1. Stable large left parietal lobe intracranial hemorrhage with  severe surrounding vasogenic edema. There is stable intraventricular  hemorrhage. There is stable left-to-right midline shift. There is  stable abnormal appearance of the brainstem which may indicate  extension of infarction versus vasogenic edema.  2. There is stable loss of sulcation and the basal cisterns  consistent with increased intracranial pressure with crowding of the  foramen magnum and low lying cerebellar tonsils.  3. Stable 5 mm left subdural hematoma.    MRI of the brain    MRA of the brain    2D Echocardiogram  - ejection fraction 60-65%. No cardiac source of emboli identified.  Carotid Doppler    CXR   IMPRESSION:  1. Endotracheal tube well positioned with its tip 3.2 cm above the  carina.  2. No acute cardiopulmonary disease.  EKG    Sinus rhythm IVCD, consider atypical RBBB Abnormal T, consider ischemia, lateral leads  Therapy Recommendations Pending  Physical Exam  General: The patient is comatose, intubated.  Cardiovascular: Regular rate and rhythm, no obvious murmurs.  Respiratory: Lung fields are clear  Abdomen: Soft, nontender, minimal bowel sounds.  Skin: No significant peripheral edema is noted.   Neurologic Exam  Mental status: The patient is intubated, comatose and unresponsive  Cranial nerves: Facial symmetry is present. Pupils are mid position, not reactive. No doll's eye movements. No corneals bilaterally. No cough or gag present. No respiratory effort above the ventilator rate. Motor: The patient has decreased motor tone of all 4 extremities.  Sensory examination: With sternal rub, there is no response. No response was seen in the deep pain stimulation on all 4 extremities.  Coordination: The patient could not cooperate for cerebellar testing.  Gait and  station: The patient could not be ambulated.  Reflexes: Deep tendon reflexes are symmetric, depressed to absent throughout.    ASSESSMENT Andrea Fox is a 80 y.o. female presenting with left posterior cerebral artery distribution stroke, hemorrhagic conversion associated with subdural hematoma. The patient appears to have malignant edema associated with this infarct with significant shift from left to right.   Left posterior cerebral artery stroke, hemorrhagic conversion  Hypertension  Dyslipidemia  Hospital day # 3  Clinical examination today shows minimal responsiveness on the part the patient, the patient has an examination that is very close to brain death at this time. The patient has trace corneal reflex on the left, minimal gag. Otherwise, no response is seen. I would expect that the patient would progress on to brain death within the next 24 hours. I will discontinue the hypertonic saline.  I had a long discussion with the family yesterday, the family wants full measures taken, full code. I indicated that the patient may progress on to brain death, and described the meaning of this diagnosis.  TREATMENT/PLAN  No antiplatelet agents  Supportive care, intubation, critical care is following and managing  Hypertonic saline therapy was discontinued  The patient is currently a full code  Apnea test for evaluation of brain death planned. Daughters are waiting on an out-of-town family member to arrive.  Long discussion with daughters regarding patient's poor prognosis, poor neurological exam, concept of brain death and need for apnea test to confirm this and answered questions  Discuss with Dr. Leonidas Romberg critical care medicine  Mikey Bussing PA-C [facility] Pager [phone] 09/21/2013, 7:59 AM This patient is critically ill and at significant risk of neurological worsening, death and care requires constant monitoring of vital signs,  hemodynamics,respiratory and cardiac monitoring,review of multiple databases, neurological assessment, discussion with family, other specialists and medical decision making of high complexity. I spent 30 minutes of neurocritical care time  in the care of  this patient. I have personally examined this patient, reviewed chart and pertinent data, and about plan of care and agree with the above Antony Contras, MD

## 2013-09-21 NOTE — Progress Notes (Signed)
On initial exam, pt no longer has cough nor gag reflex. Corneals and pupilary reflexes remain absent. Neurologist on call paged.

## 2013-09-22 MED ORDER — BIOTENE DRY MOUTH MT LIQD
15.0000 mL | Freq: Two times a day (BID) | OROMUCOSAL | Status: DC
  Administered 2013-09-22 – 2013-09-23 (×4): 15 mL via OROMUCOSAL

## 2013-09-22 MED ORDER — CHLORHEXIDINE GLUCONATE 0.12 % MT SOLN
15.0000 mL | Freq: Two times a day (BID) | OROMUCOSAL | Status: DC
  Administered 2013-09-22 – 2013-09-23 (×3): 15 mL via OROMUCOSAL
  Filled 2013-09-22 (×3): qty 15

## 2013-09-22 MED ORDER — SODIUM CHLORIDE 0.9 % IV BOLUS (SEPSIS)
1000.0000 mL | Freq: Once | INTRAVENOUS | Status: AC
  Administered 2013-09-22: 1000 mL via INTRAVENOUS

## 2013-09-22 MED ORDER — VITAL AF 1.2 CAL PO LIQD
1000.0000 mL | ORAL | Status: DC
  Administered 2013-09-22: 1000 mL
  Filled 2013-09-22 (×4): qty 1000

## 2013-09-22 MED ORDER — NOREPINEPHRINE BITARTRATE 1 MG/ML IJ SOLN
2.0000 ug/min | INTRAVENOUS | Status: DC
  Administered 2013-09-22: 2 ug/min via INTRAVENOUS
  Administered 2013-09-23: 10 ug/min via INTRAVENOUS
  Filled 2013-09-22: qty 16

## 2013-09-22 MED ORDER — SODIUM CHLORIDE 0.9 % IV SOLN
500.0000 mL | INTRAVENOUS | Status: DC
  Administered 2013-09-22: 500 mL via INTRAVENOUS
  Administered 2013-09-22: 01:00:00 via INTRAVENOUS
  Administered 2013-09-22: 500 mL via INTRAVENOUS

## 2013-09-22 MED ORDER — FREE WATER
200.0000 mL | Freq: Three times a day (TID) | Status: DC
  Administered 2013-09-22 – 2013-09-23 (×2): 200 mL

## 2013-09-22 NOTE — Progress Notes (Signed)
Stroke Team Progress Note  HISTORY Andrea Fox is an 80 y.o. female who lives alone. Patient is unable to provide any history. Went out to dinner with her family last evening 09/17/2013 and was doing fine. It seems that she was able to get up this morning 10/06/2013 and get dressed. Was found on the ground outside by a passerby. EMS was called. Patient remained unresponsive on presentation. Was intubated in the ED and started on Propofol. last known well: 09/17/2013 at 21:00. She was not a tPA candidate secondary to hemorrhage.  SUBJECTIVE Multiple family members are at the bedside.   OBJECTIVE Most recent Vital Signs: Filed Vitals:   09/22/13 0600 09/22/13 0700 09/22/13 0730 09/22/13 0800  BP: 96/52 115/55 115/55 120/56  Pulse: 75 77 77 81  Temp:   98.4 F (36.9 C)   TempSrc:   Axillary   Resp: 14 14 16 14   Height:      Weight:      SpO2: 98% 96% 100% 97%   CBG (last 3)  No results found for this basename: GLUCAP,  in the last 72 hours  IV Fluid Intake:   . sodium chloride 500 mL (09/22/13 0700)    MEDICATIONS  . antiseptic oral rinse  15 mL Mouth Rinse q12n4p  . chlorhexidine  15 mL Mouth Rinse BID  . famotidine  20 mg Per Tube BID   PRN:  acetaminophen (TYLENOL) oral liquid 160 mg/5 mL, hydrALAZINE, labetalol  Diet:    NPO Activity:  Bedrest DVT Prophylaxis:  SCD  CLINICALLY SIGNIFICANT STUDIES Basic Metabolic Panel:   Recent Labs Lab 09/20/13 0424 09/21/13 0300  NA 157* 161*  K 3.1* 3.1*  CL 120* 126*  CO2 23 22  GLUCOSE 127* 114*  BUN 12 15  CREATININE 0.58 0.77  CALCIUM 9.8 9.1   Liver Function Tests:   Recent Labs Lab 09/12/2013 0755  AST 18  ALT 14  ALKPHOS 73  BILITOT 0.5  PROT 7.3  ALBUMIN 4.0   CBC:   Recent Labs Lab 09/25/2013 0755  09/20/13 0424 09/21/13 0300  WBC 14.2*  < > 14.9* 10.7*  NEUTROABS 9.6*  --  12.4*  --   HGB 10.8*  < > 9.7* 8.8*  HCT 34.2*  < > 30.9* 28.0*  MCV 75.8*  < > 75.9* 75.3*  PLT 218  < > 154 153  < > =  values in this interval not displayed. Coagulation:   Recent Labs Lab 09/08/2013 0755  LABPROT 12.7  INR 0.97   Cardiac Enzymes:   Recent Labs Lab 09/20/2013 1059 09/16/2013 1525 10/03/2013 2057  TROPONINI <0.30 <0.30 <0.30   Urinalysis:   Recent Labs Lab 10/05/2013 0757  COLORURINE AMBER*  LABSPEC 1.030  PHURINE 5.5  GLUCOSEU NEGATIVE  HGBUR NEGATIVE  BILIRUBINUR NEGATIVE  KETONESUR NEGATIVE  PROTEINUR >300*  UROBILINOGEN 0.2  NITRITE NEGATIVE  LEUKOCYTESUR NEGATIVE   Lipid Panel    Component Value Date/Time   TRIG 91 07/28/2013 1514   HDL 55 07/28/2013 1514   CHOLHDL 3.1 07/28/2013 1514   LDLCALC 97 07/28/2013 1514   HgbA1C  No results found for this basename: HGBA1C    Urine Drug Screen:   No results found for this basename: labopia,  cocainscrnur,  labbenz,  amphetmu,  thcu,  labbarb    Alcohol Level: No results found for this basename: ETH,  in the last 168 hours  CT Head  09/20/2013   1. Stable large left parietal lobe intracranial hemorrhage with severe surrounding  vasogenic edema. There is stable intraventricular hemorrhage. There is stable left-to-right midline shift. There is stable abnormal appearance of the brainstem which may indicate extension of infarction versus vasogenic edema. 2. There is stable loss of sulcation and the basal cisterns consistent with increased intracranial pressure with crowding of the foramen magnum and low lying cerebellar tonsils. 3. Stable 5 mm left subdural hematoma.    09/19/2013   Interval increase in size of large left hemispheric intracranial hemorrhage centered in the left parietal region with marked amount of surrounding vasogenic edema involving left temporal lobe, posterior left frontal lobe, left parietal lobe and left occipital lobe.  Breakthrough of hemorrhage into the ventricle with moderate amount of blood within the right lateral ventricle.  Midline shift to the right by 1.8 cm relatively similar to prior exam.  Trapping of  the right lateral ventricle with dilated the right lateral ventricle.  Abnormal appearance of the basal ganglia/brainstem may indicate infarct and/or extension or edema from intracranial hemorrhage.  Marked loss all of sulci and cisterns consistent with uncal and downward herniation (low lying cerebellar tonsils with crowding of the foramen magnum).  Left subdural hematoma measures up to 6.8 mm maximal thickness without significant change.  09/11/2013  1. Large (at least 7.4 cm) intraparenchymal hemorrhage centered primarily within the left posterior parietal occipital lobe with associated small to moderate-sized left-sided subdural hematoma and mass effect with approximately 1.5 cm of left-to-right midline shift and uncal herniation. The dominant hemorrhage within the posterior parietal lobe is associated with a smaller (approximately 1.5 cm) intraparenchymal hemorrhage within the subcortical aspect of the contralateral right frontal lobe - constellation of findings are suggestive of recent fall with contrecoup type injury. Clinical correlation is advised. No displaced calvarial fracture. 2. No fracture or static subluxation of the cervical spine. 3. Interval development of a consolidative airspace opacity within the imaged left lung apex, not seen on chest radiograph performed earlier same day, suggestive of aspiration.  MRI of the brain    MRA of the brain    2D Echocardiogram  - ejection fraction 60-65%. No cardiac source of emboli identified.  Carotid Doppler    CXR   09/21/2013  Increasing bibasilar opacities, likely atelectasis. Suspect small bilateral effusions. 09/19/2013 1. Satisfactory position of left IJ central venous catheter. No evidence of pneumothorax. 2. Tiny opacity left costophrenic angle could reflect atelectasis or very small pleural effusion. 10/06/2013 1. Interval placement of left jugular approach intravenous catheter with tip projected over the superior cavoatrial junction. No  pneumothorax. 2. Interval placement of enteric tube with tip and side port projecting below the left hemidiaphragm. 3. Decreased lung volumes with unchanged bilateral medial basilar opacities, likely atelectasis. 09/22/2013  1. Endotracheal tube well positioned with its tip 3.2 cm above the carina. 2. No acute cardiopulmonary disease.  EKG   Sinus rhythm. IVCD, consider atypical RBBB. Abnormal T, consider ischemia, lateral leads  Therapy Recommendations   Physical Exam General: The patient is comatose, intubated. Cardiovascular: Regular rate and rhythm, no obvious murmurs. Respiratory: Lung fields are clear Abdomen: Soft, nontender, minimal bowel sounds. Skin: No significant peripheral edema is noted. Neurologic Exam Mental status: The patient is intubated, comatose and unresponsive Cranial nerves: Facial symmetry is present. Pupils are mid position, not reactive. No doll's eye movements. No corneals bilaterally. No cough or gag present. No respiratory effort above the ventilator rate. Motor: The patient has decreased motor tone of all 4 extremities. Sensory examination: With sternal rub, there is no response. No response was  seen in the deep pain stimulation on all 4 extremities. Coordination: The patient could not cooperate for cerebellar testing. Gait and station: The patient could not be ambulated. Reflexes: Deep tendon reflexes are symmetric, depressed to absent throughout.   ASSESSMENT Ms. Andrea Fox is a 80 y.o. female presenting after being found down, unresponsive. Patient with left posterior cerebral artery distribution ischemic infarct with  hemorrhagic conversion associated with subdural hematoma. The patient appears to have malignant cerebral  edema associated with this infarct with significant shift from left to right. Patient with resultant VDRF, comatose state. She is approaching brain death. Family desires full aggressive care; they are refusing apnea  testing.   Hypertension Hyperlipidemia, LDL 97, on mevacor 20 mg daily PTA, now on no statin, goal LDL < 100 Started on 3% saline during the hospitalization, inducing hypernatremia in order to decrease cerebral edema. This was stopped due to lack of futility but sodium remains elevated.   The patient is a full code  Dr. Leonie Man and Dr. Alva Garnet together discussed diagnosis, prognosis,  treatment options and plan of care with family members.    Hospital day # 4  TREATMENT/PLAN  No antiplatelet agents given hemorrhage  Ethics committee consult prior to apnoea test per family request  Will address apnea testing following Ethic discussion  Continue full code.  CCM to add tube feedings  Extensive 45 minute d/w with daughter, son and other family members  And Dr Leonidas Romberg about brain death, withdrawal of care, life support and answered questions This patient is critically ill and at significant risk of neurological worsening, death and care requires constant monitoring of vital signs, hemodynamics,respiratory and cardiac monitoring,review of multiple databases, neurological assessment, discussion with family, other specialists and medical decision making of high complexity. I spent 110 minutes of neurocritical care time  in the care of  this patient. I have personally obtained a history, examined the patient, evaluated imaging results, and formulated the assessment and plan of care. I agree with the above. Antony Contras, MD

## 2013-09-22 NOTE — Progress Notes (Signed)
Family requested to speak to a neurologist. Neurology paged. Dr. Aram Beecham did not feel comfortable discussing patient specific questions with family. [facility]Link MD notified per family request. [place] Planas

## 2013-09-22 NOTE — Progress Notes (Signed)
Patient urine output decreased to 25 cc/hr. MD notified. [place] Plan[place]

## 2013-09-22 NOTE — Progress Notes (Signed)
PULMONARY / CRITICAL CARE MEDICINE   Name: Andrea Fox MRN: [id number] DOB: 1931/10/23    ADMISSION DATE:  2013/10/02  REFERRING MD :  Trenton Gammon PRIMARY SERVICE: PCCM  CHIEF COMPLAINT:  Stroke, ICH  BRIEF PATIENT DESCRIPTION:  80 yo female with hx HTN found down 4/11.  Intubated in ER for airway protection.  CT head revealed large hemorrhage with mass effect.  Seen by nsgy, no surgical intervention planned. PCCM called to admit.   SIGNIFICANT EVENTS / STUDIES:  4/11 NS Consult: Left posterior cerebral artery infarction with secondary hemorrhage. Patient with evidence of lower brainstem function only. No indication for surgical intervention. I recommend critical care admission and stroke team care.  4/11 Stroke Team Consult: Poor prognosis conveyed to family. Hypertonic saline protocol initiated 4/11 CT head:  large intraparenchymal L post parietal/occipital hemorrhage with associated small to moderate-sized left-sided subdural hematoma and mass effect with approximately 1.5 cm of left-to-right midline shift and uncal herniation.  4/12 CT head:  Interval increase in size of large left hemispheric intracranial hemorrhage centered in the left parietal region with marked amount of surrounding vasogenic edema involving left temporal lobe, posterior left frontal lobe, left parietal lobe and left occipital lobe. Breakthrough of hemorrhage into the ventricle with moderate amount of blood within the right lateral ventricle. Midline shift to the right by 1.8 cm relatively similar to prior exam. Trapping of the right lateral ventricle with dilated the right lateral ventricle. Abnormal appearance of the basal ganglia/brainstem may indicate infarct and/or extension or edema from intracranial hemorrhage.  Marked loss all of sulci and cisterns consistent with uncal and downward herniation (low lying cerebellar tonsils with crowding of  the foramen magnum). Left subdural hematoma measures up to 6.8 mm maximal  thickness without significant change. 4/13 CT head: 1. Stable large left parietal lobe intracranial hemorrhage with severe surrounding vasogenic edema. There is stable intraventricular hemorrhage. There is stable left-to-right midline shift. There is stable abnormal appearance of the brainstem which may indicate extension of infarction versus vasogenic edema. There is stable loss of sulcation and the basal cisterns consistent with increased intracranial pressure with crowding of the foramen magnum and low lying cerebellar tonsils. Stable 5 mm left subdural hematoma. 4/13: Remains comatose with fixed pupils. Spontaneous respiratory effort present. Hypertonic saline discontinued 4/13 PM: Tachycardia and severe hypertension > nicardipine initiated.  10/06/2022:  Exam c/w brain death. Apnea test ordered > not done at family's request 4/15: Extended family conference with Dr Alva Garnet and Dr Leonie Man. Definition of brain death and evidence supporting that diagnosis provided. Family conveyed their belief that the presence of a pulse indicates life to them and expressed unwillingness to withdraw vent. Ethics consult requested with family's consent. They asked to forgo apnea test until after consultation with Ethics team.  Possible contingencies discussed   LINES / TUBES: ETT 4/11 >>  L IJ CVL 4/11 >>   CULTURES:   ANTIBIOTICS: Unasyn 4/11 >> 2022-10-06  INTERVAL HX:   Unresponsive.  VITAL SIGNS: Temp:  [96 F (35.6 C)-98.4 F (36.9 C)] 98.4 F (36.9 C) (04/15 0730) Pulse Rate:  [64-88] 81 (04/15 1155) Resp:  [0-17] 14 (04/15 1155) BP: (76-136)/(45-70) 114/53 mmHg (04/15 1155) SpO2:  [96 %-100 %] 99 % (04/15 1155) FiO2 (%):  [30 %] 30 % (04/15 1156) HEMODYNAMICS:   VENTILATOR SETTINGS: Vent Mode:  [-] PRVC FiO2 (%):  [30 %] 30 % Set Rate:  [14 bmp] 14 bmp Vt Set:  [550 mL] 550 mL PEEP:  [5 cmH20] 5  cmH20 Plateau Pressure:  [17 cmH20-19 cmH20] 17 cmH20 INTAKE / OUTPUT: Intake/Output     04/14 0701 -  04/15 0700 04/15 0701 - 04/16 0700   I.V. (mL/kg) 1034.5 (13) 120 (1.5)   Other     IV Piggyback     Total Intake(mL/kg) 1034.5 (13) 120 (1.5)   Urine (mL/kg/hr) 1280 (0.7)    Emesis/NG output     Total Output 1280     Net -245.5 +120          PHYSICAL EXAMINATION: General: Unresponsive Neuro: No spont resp effort, all brain stem reflexes absent HEENT: [place] Cardiovascular: regular, no M Lungs: clear anteriorly  Abdomen:  Soft, +BS Ext: Warm and dry, no edema   LABS: I have reviewed all of today's lab results. 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 Cerebral edema Brain death P:   Ethics consult Apnea test planned after Ethics team consultation  PULMONARY Acute respiratory failure due to brain death P:   Cont full support Cont vent bundle Tracheostomy tube not an option presently  CARDIOVASCULAR HTN P:  Cont current BP management   RENAL Hypokalemia  Hypernatremia P:   Monitor BMET intermittently Monitor I/Os Correct electrolytes as indicated Free water added  GASTROINTESTINAL No active issue  P:   Cont SUP TFs ordered  HEMATOLOGIC No issues P:  DVT px: SCD's  Monitor CBC intermittently  INFECTIOUS Fever, resolved P:   Micro and abx as above  ENDOCRINE Hyperglycemia, resolved P:   No further CBGs/SSI  30 min family conference as documented above CCM time: 18 mins  Merton Border, MD ; [facility] service Mobile [phone].  After 5:30 PM or weekends, call [phone]

## 2013-09-22 NOTE — Progress Notes (Signed)
Patient's blood pressure slowing decreasing. Last blood pressure of 76/45 (53). Dr. Aram Beecham notified. 1000 cc bolus ordered. Will continue to monitor. [place] Plan[place]

## 2013-09-23 DIAGNOSIS — G9382 Brain death: Secondary | ICD-10-CM | POA: Diagnosis not present

## 2013-09-23 MED ORDER — SODIUM CHLORIDE 0.9 % IV BOLUS (SEPSIS)
500.0000 mL | Freq: Once | INTRAVENOUS | Status: AC
  Administered 2013-09-23: 500 mL via INTRAVENOUS

## 2013-09-23 MED ORDER — FAMOTIDINE 40 MG/5ML PO SUSR: 20.0000 mg | Freq: Every day | ORAL | Status: DC

## 2013-09-24 MED FILL — Medication: Qty: 1 | Status: AC

## 2013-09-24 NOTE — Discharge Summary (Signed)
DEATH SUMMARY  DATE OF ADMISSION:  10-07-2013  DATE OF DISCHARGE/DEATH:  10-12-2013  ADMISSION DIAGNOSES:   ICH  SDH  Acute respiratory failure    HTN  Hypokalemia   Possible aspiration  Leukocytosis  Hyperglycemia      DISCHARGE DIAGNOSES:   Massive L post cerebral artery CVA  ICH due to hemorrhagic transformation  Cerebral edema  Brain death  Acute respiratory failure due to AMS HTN - resolved  Hypotension  Hypokalemia  Hypernatremia  Fever, resolved  Hyperglycemia, resolved    PRESENTATION:   Pt was admitted with the following HPI and the above admission diagnoses:  HISTORY OF PRESENT ILLNESS: 80 yo female with hx HTN, otherwise healthy, in usual state of health until she was found unconscious outside her apartment 2022-10-08. On no anticoagulation at home. No known hx trauma. Intubated in ER. CT head revealed large intraparenchymal L post parietal/occipital hemorrhage with associated small to moderate-sized left-sided subdural hematoma and mass effect with approximately 1.5 cm of left-to-right midline shift and uncal herniation.    HOSPITAL COURSE:   2022-10-08 NS Consult: Left posterior cerebral artery infarction with secondary hemorrhage. Patient with evidence of lower brainstem function only. No indication for surgical intervention. NS recommended critical care admission and stroke team care.  10/08/2022 Stroke Team Consult: Poor prognosis conveyed to family. Hypertonic saline protocol initiated  2022/10/08 CT head: large intraparenchymal L post parietal/occipital hemorrhage with associated small to moderate-sized left-sided subdural hematoma and mass effect with approximately 1.5 cm of left-to-right midline shift and uncal herniation.  4/12 CT head: Interval increase in size of large left hemispheric intracranial hemorrhage centered in the left parietal region with marked amount of surrounding vasogenic edema involving left temporal lobe, posterior left frontal lobe, left parietal lobe and left  occipital lobe. Breakthrough of hemorrhage into the ventricle with moderate amount of blood within the right lateral ventricle. Midline shift to the right by 1.8 cm relatively similar to prior exam. Trapping of the right lateral ventricle with dilated the right lateral ventricle. Abnormal appearance of the basal ganglia/brainstem may indicate infarct and/or extension or edema from intracranial hemorrhage.  Marked loss all of sulci and cisterns consistent with uncal and downward herniation (low lying cerebellar tonsils with crowding of  the foramen magnum). Left subdural hematoma measures up to 6.8 mm maximal thickness without significant change.  4/13 CT head: 1. Stable large left parietal lobe intracranial hemorrhage with severe surrounding vasogenic edema. There is stable intraventricular hemorrhage. There is stable left-to-right midline shift. There is stable abnormal appearance of the brainstem which may indicate extension of infarction versus vasogenic edema. There is stable loss of sulcation and the basal cisterns consistent with increased intracranial pressure with crowding of the foramen magnum and low lying cerebellar tonsils. Stable 5 mm left subdural hematoma.  4/13: Remains comatose with fixed pupils. Spontaneous respiratory effort present. Hypertonic saline discontinued  4/13 PM: Tachycardia and severe hypertension > nicardipine initiated.  2022/10/11 AM: Exam c/w brain death. Family apprised by Dr Leonie Man. Apnea test ordered > not done at family's request  4/15 AM: Norepinephrine initiated for hypotension.  4/15 Extended family conference with Dr Alva Garnet and Dr Leonie Man. Definition of brain death and evidence supporting that diagnosis provided. Family conveyed their belief that the presence of a pulse indicates life to them and expressed unwillingness to withdraw vent. Ethics consult requested with family's consent. They asked to forgo apnea test until after consultation with Ethics team. Possible  contingencies discussed. All further labs and studies discontinued. No  escalation of therapies planned  4/16: Ethics consult performed with agreement to undertake testing for brain death. Family continued to ask for "a day to get affairs in order" 4/16 PM: progressive hypotension and bradycardia. Ultimate asystolic cardiac arrest. Code Blue as family continued to desire full code status. Resuscitative efforts discontinued upon arrival of MD on basis of medical futility     Cause of death:  Massive L post cerebral artery CVA with ICH due to hemorrhagic transformation and cerebral edema    Autopsy: No  Smoking: No   Merton Border, MD;  PCCM service; Mobile [phone]

## 2013-10-08 NOTE — Progress Notes (Signed)
[facility] Progress Note Patient Name: Andrea Fox DOB: Nov 27, 1931 MRN: [id number]  Date of Service  10/07/2013   HPI/Events of Note   High residuals  eICU Interventions  Manage, hold      Raylene Miyamoto 10/03/2013, 4:16 AM

## 2013-10-08 NOTE — Progress Notes (Signed)
Patient's tube feeding residual was 600 mL. Dr. Titus Mould notified. Residual not re-fed and tube feedings turned off. [place] Plan[place]

## 2013-10-08 NOTE — Progress Notes (Signed)
Spoke with ME Sabra Heck. Per ME, pt not ME case, okay to remove all tubes/equipment.

## 2013-10-08 NOTE — Progress Notes (Signed)
Stroke Team Progress Note  HISTORY Andrea Fox is an 80 y.o. female who lives alone. Patient is unable to provide any history. Went out to dinner with her family last evening 09/17/2013 and was doing fine. It seems that she was able to get up this morning 09/30/2013 and get dressed. Was found on the ground outside by a passerby. EMS was called. Patient remained unresponsive on presentation. Was intubated in the ED and started on Propofol. last known well: 09/17/2013 at 21:00. She was not a tPA candidate secondary to hemorrhage.  SUBJECTIVE No family at bedside currently. RN present at the bedside.  OBJECTIVE Most recent Vital Signs: Filed Vitals:   09/13/2013 0815 09/10/2013 0819 09/14/2013 0830 10/01/2013 0845  BP: 97/46 97/46 78/42  99/56  Pulse: 82 81 81 79  Temp:      TempSrc:      Resp: 14 14 14 14   Height:      Weight:      SpO2: 96% 93% 94% 96%   CBG (last 3)  No results found for this basename: GLUCAP,  in the last 72 hours  IV Fluid Intake:   . sodium chloride 500 mL (09/09/2013 0700)  . feeding supplement (VITAL AF 1.2 CAL) 1,000 mL (09/30/2013 0834)  . norepinephrine (LEVOPHED) Adult infusion 10 mcg/min (09/24/2013 1761)    MEDICATIONS  . antiseptic oral rinse  15 mL Mouth Rinse q12n4p  . chlorhexidine  15 mL Mouth Rinse BID  . famotidine  20 mg Per Tube BID  . free water  200 mL Per Tube 3 times per day   PRN:  acetaminophen (TYLENOL) oral liquid 160 mg/5 mL, hydrALAZINE, labetalol  Diet:    NPO Activity:  Bedrest DVT Prophylaxis:  SCD  CLINICALLY SIGNIFICANT STUDIES Basic Metabolic Panel:   Recent Labs Lab 09/20/13 0424 09/21/13 0300  NA 157* 161*  K 3.1* 3.1*  CL 120* 126*  CO2 23 22  GLUCOSE 127* 114*  BUN 12 15  CREATININE 0.58 0.77  CALCIUM 9.8 9.1   Liver Function Tests:   Recent Labs Lab 09/08/2013 0755  AST 18  ALT 14  ALKPHOS 73  BILITOT 0.5  PROT 7.3  ALBUMIN 4.0   CBC:   Recent Labs Lab 10/05/2013 0755  09/20/13 0424 09/21/13 0300  WBC  14.2*  < > 14.9* 10.7*  NEUTROABS 9.6*  --  12.4*  --   HGB 10.8*  < > 9.7* 8.8*  HCT 34.2*  < > 30.9* 28.0*  MCV 75.8*  < > 75.9* 75.3*  PLT 218  < > 154 153  < > = values in this interval not displayed. Coagulation:   Recent Labs Lab 09/15/2013 0755  LABPROT 12.7  INR 0.97   Cardiac Enzymes:   Recent Labs Lab 09/12/2013 1059 09/22/2013 1525 09/09/2013 2057  TROPONINI <0.30 <0.30 <0.30   Urinalysis:   Recent Labs Lab 09/28/2013 0757  COLORURINE AMBER*  LABSPEC 1.030  PHURINE 5.5  GLUCOSEU NEGATIVE  HGBUR NEGATIVE  BILIRUBINUR NEGATIVE  KETONESUR NEGATIVE  PROTEINUR >300*  UROBILINOGEN 0.2  NITRITE NEGATIVE  LEUKOCYTESUR NEGATIVE   Lipid Panel    Component Value Date/Time   TRIG 91 07/28/2013 1514   HDL 55 07/28/2013 1514   CHOLHDL 3.1 07/28/2013 1514   LDLCALC 97 07/28/2013 1514   HgbA1C  No results found for this basename: HGBA1C    Urine Drug Screen:   No results found for this basename: labopia,  cocainscrnur,  labbenz,  amphetmu,  thcu,  labbarb  Alcohol Level: No results found for this basename: ETH,  in the last 168 hours  CT Head  09/20/2013   1. Stable large left parietal lobe intracranial hemorrhage with severe surrounding vasogenic edema. There is stable intraventricular hemorrhage. There is stable left-to-right midline shift. There is stable abnormal appearance of the brainstem which may indicate extension of infarction versus vasogenic edema. 2. There is stable loss of sulcation and the basal cisterns consistent with increased intracranial pressure with crowding of the foramen magnum and low lying cerebellar tonsils. 3. Stable 5 mm left subdural hematoma.    09/19/2013   Interval increase in size of large left hemispheric intracranial hemorrhage centered in the left parietal region with marked amount of surrounding vasogenic edema involving left temporal lobe, posterior left frontal lobe, left parietal lobe and left occipital lobe.  Breakthrough of  hemorrhage into the ventricle with moderate amount of blood within the right lateral ventricle.  Midline shift to the right by 1.8 cm relatively similar to prior exam.  Trapping of the right lateral ventricle with dilated the right lateral ventricle.  Abnormal appearance of the basal ganglia/brainstem may indicate infarct and/or extension or edema from intracranial hemorrhage.  Marked loss all of sulci and cisterns consistent with uncal and downward herniation (low lying cerebellar tonsils with crowding of the foramen magnum).  Left subdural hematoma measures up to 6.8 mm maximal thickness without significant change.  09/30/2013  1. Large (at least 7.4 cm) intraparenchymal hemorrhage centered primarily within the left posterior parietal occipital lobe with associated small to moderate-sized left-sided subdural hematoma and mass effect with approximately 1.5 cm of left-to-right midline shift and uncal herniation. The dominant hemorrhage within the posterior parietal lobe is associated with a smaller (approximately 1.5 cm) intraparenchymal hemorrhage within the subcortical aspect of the contralateral right frontal lobe - constellation of findings are suggestive of recent fall with contrecoup type injury. Clinical correlation is advised. No displaced calvarial fracture. 2. No fracture or static subluxation of the cervical spine. 3. Interval development of a consolidative airspace opacity within the imaged left lung apex, not seen on chest radiograph performed earlier same day, suggestive of aspiration.  MRI of the brain    MRA of the brain    2D Echocardiogram  - ejection fraction 60-65%. No cardiac source of emboli identified.  Carotid Doppler    CXR   09/21/2013  Increasing bibasilar opacities, likely atelectasis. Suspect small bilateral effusions. 09/19/2013 1. Satisfactory position of left IJ central venous catheter. No evidence of pneumothorax. 2. Tiny opacity left costophrenic angle could reflect  atelectasis or very small pleural effusion. 09/17/2013 1. Interval placement of left jugular approach intravenous catheter with tip projected over the superior cavoatrial junction. No pneumothorax. 2. Interval placement of enteric tube with tip and side port projecting below the left hemidiaphragm. 3. Decreased lung volumes with unchanged bilateral medial basilar opacities, likely atelectasis. 23-Sep-2013  1. Endotracheal tube well positioned with its tip 3.2 cm above the carina. 2. No acute cardiopulmonary disease.  EKG   Sinus rhythm. IVCD, consider atypical RBBB. Abnormal T, consider ischemia, lateral leads  Therapy Recommendations   Physical Exam General: The patient is comatose, intubated. Cardiovascular: Regular rate and rhythm, no obvious murmurs. Respiratory: Lung fields are clear Abdomen: Soft, nontender, minimal bowel sounds. Skin: No significant peripheral edema is noted. Neurologic Exam Mental status: The patient is intubated, comatose and unresponsive Cranial nerves: Facial symmetry is present. Pupils are mid position, not reactive. No doll's eye movements. No corneals bilaterally. No cough  or gag present. No respiratory effort above the ventilator rate. Motor: The patient has decreased motor tone of all 4 extremities. Sensory examination: With sternal rub, there is no response. No response was seen in the deep pain stimulation on all 4 extremities. Coordination: The patient could not cooperate for cerebellar testing. Gait and station: The patient could not be ambulated. Reflexes: Deep tendon reflexes are symmetric, depressed to absent throughout.   ASSESSMENT Ms. Andrea Fox is a 80 y.o. female presenting after being found down, unresponsive. Patient with left posterior cerebral artery distribution ischemic infarct with  hemorrhagic conversion associated with subdural hematoma. The patient appears to have malignant cerebral  edema associated with this infarct with significant  shift from left to right. Patient with resultant VDRF, comatose state. Her exam is consistent with brain death. Family desires full aggressive care; they are refusing apnea testing to confirm brain death. They believe a beating heart is equal to life.   Hypertension Hyperlipidemia, LDL 97, on mevacor 20 mg daily PTA, now on no statin, goal LDL < 100  The patient is a full code  Hospital day # 5  TREATMENT/PLAN  No antiplatelet agents given hemorrhagic conversion  Ethics committee consult prior to apnoea test per family request. Scheduled for today  Will address apnea testing following Ethic discussion  Continue full code.  Resume statin based on plan of care  This patient is critically ill and at significant risk of neurological worsening, death and care requires constant monitoring of vital signs, hemodynamics,respiratory and cardiac monitoring,review of multiple databases, neurological assessment, discussion with family, other specialists and medical decision making of high complexity. I spent 30 minutes of neurocritical care time  in the care of  this patient.  I have personally obtained a history, examined the patient, evaluated imaging results, and formulated the assessment and plan of care. I agree with the above. Antony Contras, MD

## 2013-10-08 NOTE — Progress Notes (Signed)
Chaplain responded to a request for prayer with gathered family members.  Family had left for "quiet time," in the unit, but were recalled when pt bp dropped.  Family were at bedside and quietly tearful, yet determined to "not let mama down or abandon mama in her time of need." Family asked for prayers for "complete healing and restoration to life."  Chaplain offered emotional support through empathetic listening and presence.  Spiritual support was offered through reading of sacred texts and prayers.  Family is wanting to stay at bedside as much as possible so "she does not feel alone."  Chaplain also provided young great-grandchild with coloring pages and crayons while family is in the room. Chaplains are available for further support as desired.   September 28, 2013 1320  Clinical Encounter Type  Visited With Family;Health care provider  Visit Type Follow-up;Spiritual support;Critical Care  Referral From Nurse;Family  Spiritual Encounters  Spiritual Needs Prayer;[facility] text;Emotional;Grief support  Stress Factors  Patient Stress Factors None identified  Family Stress Factors Major life changes;Loss of control;Dufur Jerzy Roepke, Chaplain

## 2013-10-08 NOTE — Progress Notes (Signed)
NUTRITION FOLLOW-UP  INTERVENTION:  Recommend Vital High Protein @ goal rate of 60 ml/hr if able to tolerate advancement.   Tube feeding regimen provides 1440 kcal, 126 grams of protein, and 1203 ml of H2O.    NUTRITION DIAGNOSIS: Inadequate oral intake related to inability to eat as evidenced by NPO status; ongoing  Goal: Pt to meet >/= 90% of their estimated nutrition needs, not met.   Monitor:  Plan of care, TF tolerance, labs, weight trend   ASSESSMENT: Patient is currently intubated on ventilator support due to left PCA infarct with hemorrhage. Per MD note not surgical candidate and likely close to herniation. Family wants aggressive care.  Pt discussed during ICU rounds and with RN.  Ethics consult pending.  MV: 7.6 L/min Temp (24hrs), Avg:97.8 F (36.6 C), Min:97.8 F (36.6 C), Max:97.9 F (36.6 C)  TF: Vital High Protein @ 40 ml/hr ordered 4/15. However, pt had episode of high residuals >600 therefore TF held. TF restarted @ 10 ml/hr this morning.   Potassium low.  Height: Ht Readings from Last 1 Encounters:  09/27/2013 5' 10"  (1.778 m)    Weight: Wt Readings from Last 1 Encounters:  09/20/13 175 lb 4.3 oz (79.5 kg)  Admission weight 180 lb (81.6 kg) 4/11  BMI:  Body mass index is 25.15 kg/(m^2).  Estimated Nutritional Needs: Kcal: 0998 Protein: 97-110 grams Fluid: >1.5 L/day  Skin: no issues noted  Diet Order:     Intake/Output Summary (Last 24 hours) at 2013/10/05 0844 Last data filed at Oct 05, 2013 0834  Gross per 24 hour  Intake 1326.93 ml  Output   2910 ml  Net -1583.07 ml    Last BM: 4/11  Labs:   Recent Labs Lab 09/19/13 0900  09/20/13 0145 09/20/13 0424 09/21/13 0300  NA 158*  < > 159* 157* 161*  K 3.5*  --   --  3.1* 3.1*  CL 119*  --   --  120* 126*  CO2 23  --   --  23 22  BUN 11  --   --  12 15  CREATININE 0.65  --   --  0.58 0.77  CALCIUM 9.1  --   --  9.8 9.1  GLUCOSE 122*  --   --  127* 114*  < > = values in this  interval not displayed.  CBG (last 3)  No results found for this basename: GLUCAP,  in the last 72 hours  Scheduled Meds: . antiseptic oral rinse  15 mL Mouth Rinse q12n4p  . chlorhexidine  15 mL Mouth Rinse BID  . famotidine  20 mg Per Tube BID  . free water  200 mL Per Tube 3 times per day    Continuous Infusions: . sodium chloride 500 mL (10-05-2013 0700)  . feeding supplement (VITAL AF 1.2 CAL) 1,000 mL (2013-10-05 0834)  . norepinephrine (LEVOPHED) Adult infusion 10 mcg/min (2013-10-05 0832)     [place], [place], CNSC [phone] Pager [phone] After Hours Pager

## 2013-10-08 NOTE — Ethics Note (Signed)
Ethics consult called at the request of the family. Present for consult: Myself and Lenoria Farrier as Audiological scientist members. Shon Baton Unit director and Jenny Reichmann, bedside nurse Colletta Maryland, daughter and major spokesperson Mardene Celeste, daughter Rocco Serene, granddaughter Jori Moll, son Velva Harman, sister who was present by phone. Not present: three other sons - one of which had heart surgery this morning.  Family asked for the meeting.  They wanted a better understanding of the facts and their rights.  Discussion:  This is a united family who are advocating for their mother/sister.  It is hard because Anntonette Madewell was vibrant and active just a week ago.  Now she has a devastating neurologic injury and may be brain dead.    They had some questions about brain death and we made it clear that [place] legal statutes recognized brain death, which could be the sole determinant of death.  Shanon Brow was going to give Colletta Maryland a copy of the statute.    We all agreed that it was necessary to determine brain death.  They are very aware that if Ms. Savino is brain dead we would cease all treatments.  They want that certainty - timing is just the issue.  The family is united that if Ms. Kercheval is not brain dead, they would want continued treatments.  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  I pressed them to make sure this was really Ms. Pagan's belief and not theirs - and all agreed she would want to press forward with treatments.  They also told a story of a relative who was given only months to live, was pressed to sign a DNR (refused) and ended up living 10 more years.  They have a strong faith and repeatedly referenced God being in charge.   I tried to clarify the timing issue and still do not fully understand.  They came to this meeting thinking they would just get more information and that they would not be pressed for a decision.  They also made mention of needing a day to get affairs in order (I am  unclear what that means.)  I also made it clear that ultimately, the decision to test for brain death was the physician's, not the family's.    Suggested action items: 1. I am still optimistic that we can have joint decision making with the family.   2. We should test for brain death.  Velva Harman (an ex nurse?) specifically mentioned getting an EEG.  I said the specific testing was up to neuro, Dr. Leonie Man.  The providers should be rigorous in testing for brain death and make sure she meets the official criteria.  They are aware that we do not continue treatments on patients who are brain dead. 3. I think it is worth waiting until tomorrow to do the brain death testing in the name of being sensitive to family needs and fostering joint decision making.  Colletta Maryland said she would call back with their decision to [facility] later this afternoon.  (I again countered that it is really the doctors decision and that we would give them some time.) 4. If she is not brain dead, the family will want continued treatments.  They are aware she is on pressure support and may expire despite continued aggressive treatment  - that would be God's will.  Velva Harman, the sister on the phone was the only one to express some thoughts that it is time to let Ms. Buckwalter go.  Over time, they might  change their approach.  That would not happen quickly.   We seemed to leave with a consensus (except for the timing issue) and good rapport.  Ethics will be happy to do a follow up consult if it would be helpful.

## 2013-10-08 NOTE — Progress Notes (Signed)
Called by bedside RN, patient is refractory shock, brady then arrest.  All notes reviewed.  ACLS protocol followed for a total of 5 minutes.  No ROSC noted and patient was declared dead and family notified by RN staff.  PCCM-NP bedside, no pulses noted.  Patient declared dead at 8:08 PM.  RN staff to notify family, if they wish to speak to MD please feel free to call me.  Rush Farmer, M.D. [facility] Pulmonary/Critical Care Medicine. Pager: [phone]. After hours pager: [phone].

## 2013-10-08 NOTE — Progress Notes (Signed)
Dr. Alva Garnet, CCM, was contacted concerning MAP parameters not being met with ordered medication, which is at maximum dose per order. No new orders given; continue present course of treatment. Will continue to monitor.

## 2013-10-08 NOTE — Progress Notes (Signed)
Pt HR from 90's down to 30's. Pt assessed, no femoral pulse noted. CPR begun at 25-Sep-2000. [place] notified and Code Team. Review code documentation for times/meds given. CCM-NP pronounced pt death at 09-26-2006. Family notified and chaplain called to be with family.

## 2013-10-08 NOTE — Progress Notes (Signed)
PULMONARY / CRITICAL CARE MEDICINE   Name: Andrea Fox MRN: [id number] DOB: May 10, 1932    ADMISSION DATE:  09/11/2013  REFERRING MD :  Trenton Gammon PRIMARY SERVICE: PCCM  CHIEF COMPLAINT:  Stroke, ICH  BRIEF PATIENT DESCRIPTION:  80 yo female with hx HTN found down 4/11.  Intubated in ER for airway protection.  CT head revealed large hemorrhage with mass effect.  Seen by nsgy, no surgical intervention planned. PCCM called to admit.   SIGNIFICANT EVENTS / STUDIES:  4/11 NS Consult: Left posterior cerebral artery infarction with secondary hemorrhage. Patient with evidence of lower brainstem function only. No indication for surgical intervention. I recommend critical care admission and stroke team care.  4/11 Stroke Team Consult: Poor prognosis conveyed to family. Hypertonic saline protocol initiated 4/11 CT head:  large intraparenchymal L post parietal/occipital hemorrhage with associated small to moderate-sized left-sided subdural hematoma and mass effect with approximately 1.5 cm of left-to-right midline shift and uncal herniation.  4/12 CT head:  Interval increase in size of large left hemispheric intracranial hemorrhage centered in the left parietal region with marked amount of surrounding vasogenic edema involving left temporal lobe, posterior left frontal lobe, left parietal lobe and left occipital lobe. Breakthrough of hemorrhage into the ventricle with moderate amount of blood within the right lateral ventricle. Midline shift to the right by 1.8 cm relatively similar to prior exam. Trapping of the right lateral ventricle with dilated the right lateral ventricle. Abnormal appearance of the basal ganglia/brainstem may indicate infarct and/or extension or edema from intracranial hemorrhage.  Marked loss all of sulci and cisterns consistent with uncal and downward herniation (low lying cerebellar tonsils with crowding of  the foramen magnum). Left subdural hematoma measures up to 6.8 mm maximal  thickness without significant change. 4/13 CT head: 1. Stable large left parietal lobe intracranial hemorrhage with severe surrounding vasogenic edema. There is stable intraventricular hemorrhage. There is stable left-to-right midline shift. There is stable abnormal appearance of the brainstem which may indicate extension of infarction versus vasogenic edema. There is stable loss of sulcation and the basal cisterns consistent with increased intracranial pressure with crowding of the foramen magnum and low lying cerebellar tonsils. Stable 5 mm left subdural hematoma. 4/13: Remains comatose with fixed pupils. Spontaneous respiratory effort present. Hypertonic saline discontinued 4/13 PM: Tachycardia and severe hypertension > nicardipine initiated.  09/27/22:  Exam c/w brain death. Apnea test ordered > not done at family's request 4/15: Norepinephrine initiated for hypotension. Extended family conference with Dr Alva Garnet and Dr Leonie Man. Definition of brain death and evidence supporting that diagnosis provided. Family conveyed their belief that the presence of a pulse indicates life to them and expressed unwillingness to withdraw vent.  Ethics consult requested with family's consent. They asked to forgo apnea test until after consultation with Ethics team.  Possible contingencies discussed. All further labs and studies discontinued. No escalation of therapies planned 4/16: Ethics consult scheduled for 12:00. Plan for apnea test and withdrawal after that.    LINES / TUBES: ETT 4/11 >>  L IJ CVL 4/11 >>   CULTURES:   ANTIBIOTICS: Unasyn 4/11 >> 09-27-22  INTERVAL HX:   Unresponsive.  VITAL SIGNS: Temp:  [97.8 F (36.6 C)-97.9 F (36.6 C)] 97.8 F (36.6 C) (04/16 0321) Pulse Rate:  [64-97] 80 (04/16 0915) Resp:  [0-14] 14 (04/16 0915) BP: (68-215)/(40-102) 97/54 mmHg (04/16 0915) SpO2:  [93 %-100 %] 97 % (04/16 0915) FiO2 (%):  [30 %] 30 % (04/16 0900) HEMODYNAMICS:   VENTILATOR SETTINGS:  Vent Mode:   [-] PRVC FiO2 (%):  [30 %] 30 % Set Rate:  [14 bmp] 14 bmp Vt Set:  [550 mL] 550 mL PEEP:  [5 cmH20] 5 cmH20 Plateau Pressure:  [15 cmH20-18 cmH20] 17 cmH20 INTAKE / OUTPUT: Intake/Output     04/15 0701 - 04/16 0700 04/16 0701 - 04/17 0700   I.V. (mL/kg) 772.5 (9.7) 93.8 (1.2)   NG/GT 520 20   Total Intake(mL/kg) 1292.5 (16.3) 113.8 (1.4)   Urine (mL/kg/hr) 2875 (1.5) 295 (1.3)   Total Output 2875 295   Net -1582.5 -181.2          PHYSICAL EXAMINATION: General: Unresponsive Neuro: No spont resp effort, all brain stem reflexes absent, flaccid, DTRs absent, no withdrawal from painful stimuli HEENT: [place] Cardiovascular: regular, no M Lungs: clear anteriorly  Abdomen:  Soft, +BS Ext: Warm, no edema   LABS: I have reviewed all of today's lab results. 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 Cerebral edema Brain death P:   Ethics consult today Apnea test planned after Ethics team consultation  PULMONARY Acute respiratory failure due to brain death P:   Cont full support Cont vent bundle Tracheostomy tube not an option  CARDIOVASCULAR HTN - resolved Hypotension on  P:  Cont current BP management   RENAL Hypokalemia  Hypernatremia P:   Monitor BMET intermittently Monitor I/Os Correct electrolytes as indicated Free water added  GASTROINTESTINAL No active issue  P:   Cont SUP TFs ordered  HEMATOLOGIC No issues P:  DVT px: SCD's  Monitor CBC intermittently  INFECTIOUS Fever, resolved P:   Micro and abx as above  ENDOCRINE Hyperglycemia, resolved P:   No further CBGs/SSI   Merton Border, MD ; [facility] service Mobile [phone].  After 5:30 PM or weekends, call [phone]

## 2013-10-08 DEATH — deceased

## 2013-10-14 ENCOUNTER — Other Ambulatory Visit: Payer: Self-pay | Admitting: *Deleted

## 2013-10-27 ENCOUNTER — Ambulatory Visit: Payer: Medicare Other | Admitting: Internal Medicine

## 2014-06-02 IMAGING — CT CT HEAD W/O CM
1 of 3 series · 14 of 30 positions shown, 18 images · non-contrast
Comparison: 09/18/2013 CT.

CLINICAL DATA: Unresponsive.  Intracranial hemorrhage.

EXAM:
CT HEAD WITHOUT CONTRAST
TECHNIQUE: Contiguous axial images were obtained from the base of the skull
through the vertex without intravenous contrast.

[Series 3: head 2.0 h70h · axial · 0.44mm/px · z∈[+1032,+1168]mm · 14 of 78 slices shown, 18 images]
[im 5/78  brain]
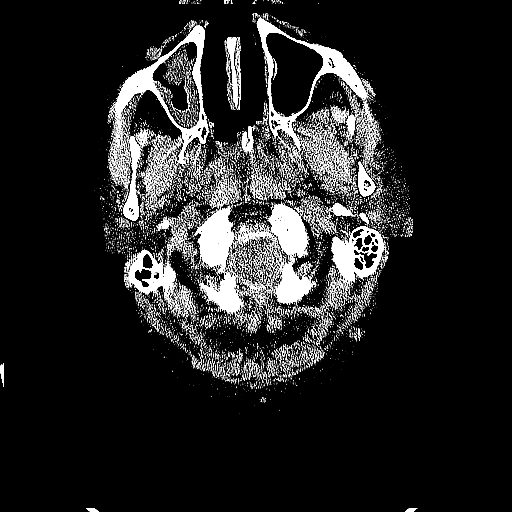
[im 5/78  bone]
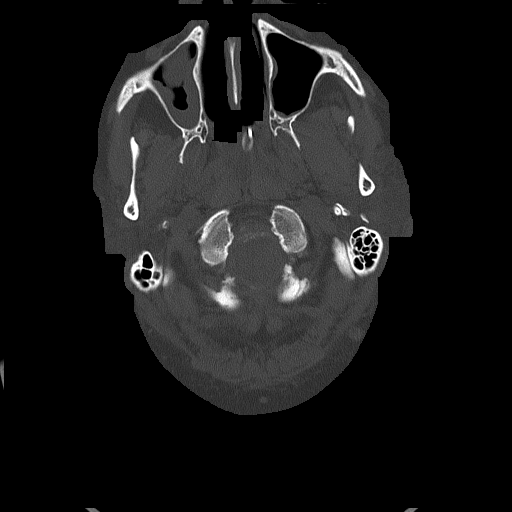
[im 10/78  brain]
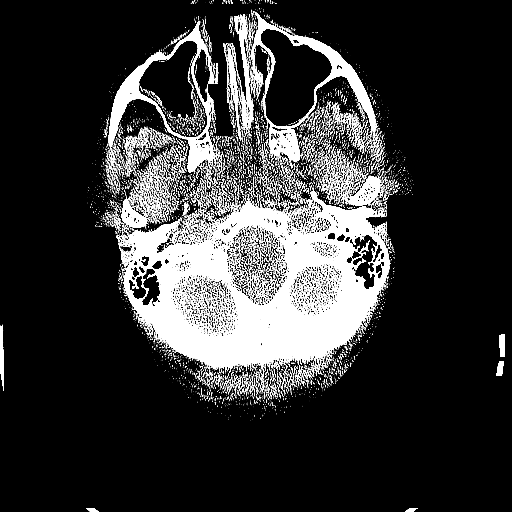
[im 15/78  brain]
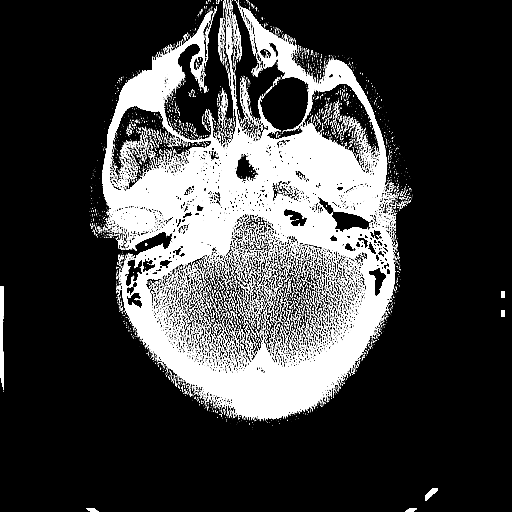
[im 20/78  brain]
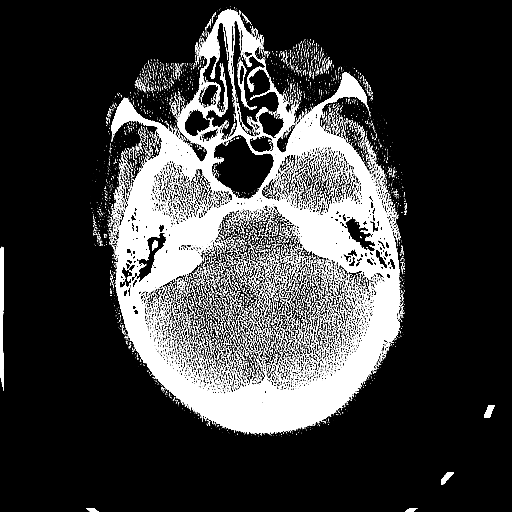
[im 25/78  brain]
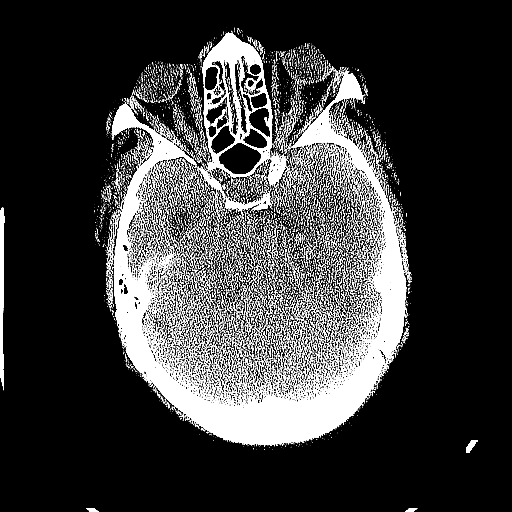
[im 25/78  bone]
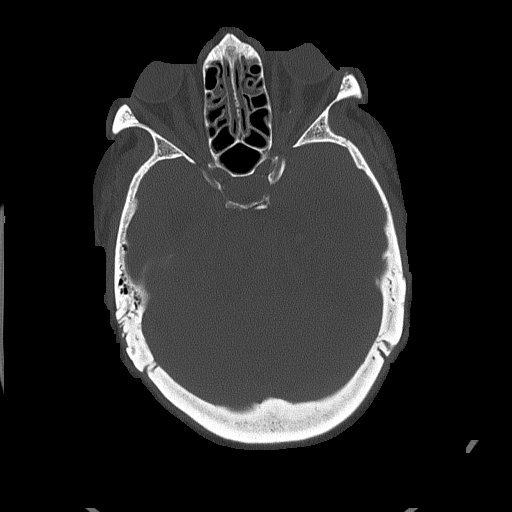
[im 29/78  brain]
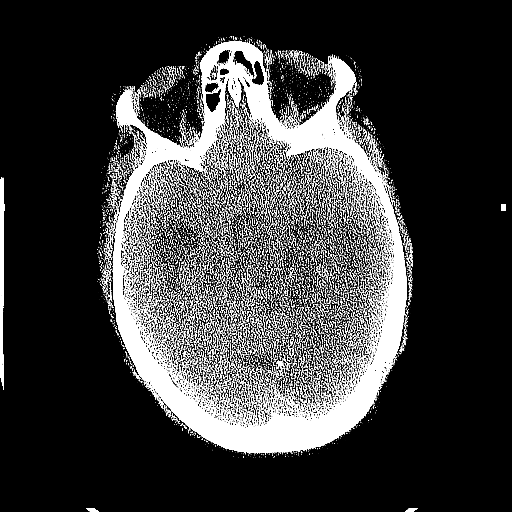
[im 34/78  brain]
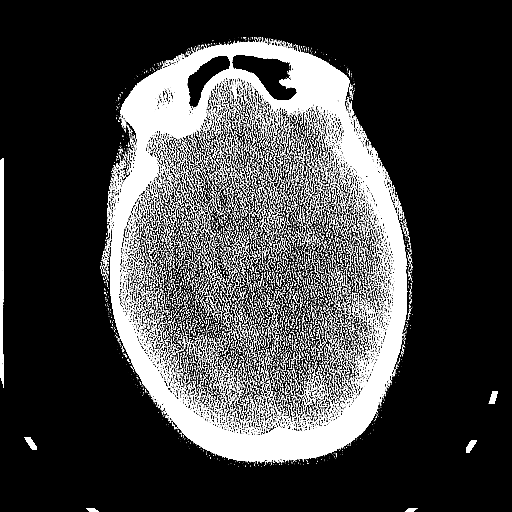
[im 44/78  brain]
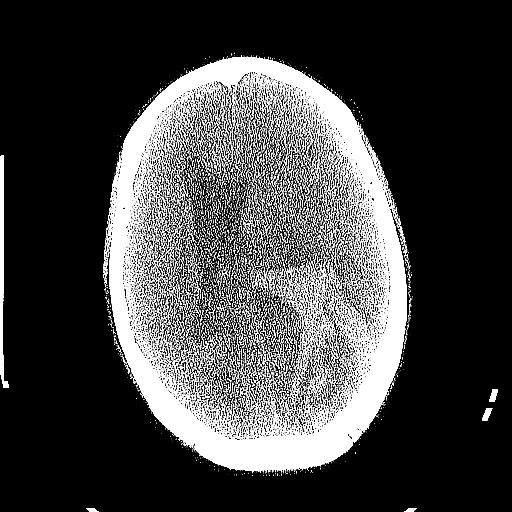
[im 49/78  brain]
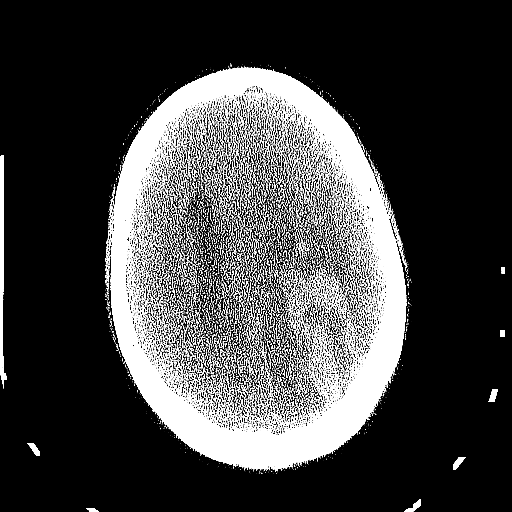
[im 49/78  bone]
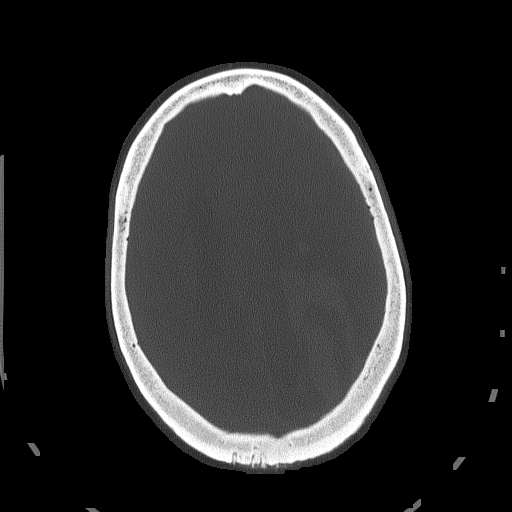
[im 53/78  brain]
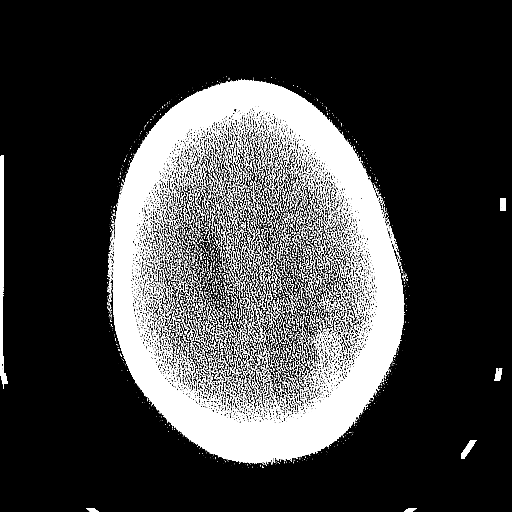
[im 58/78  brain]
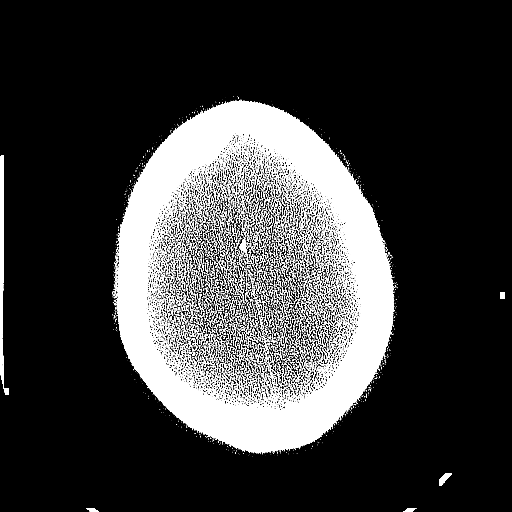
[im 63/78  brain]
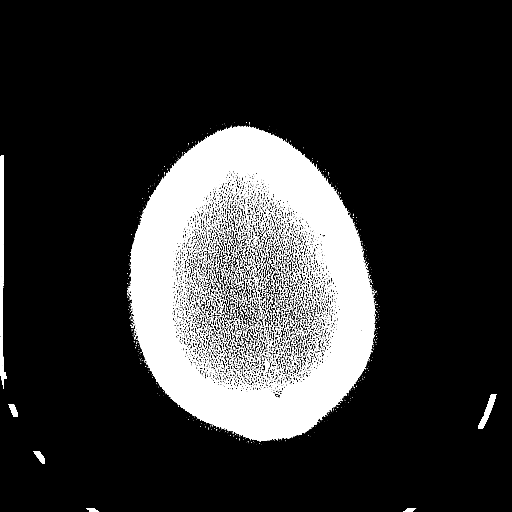
[im 68/78  brain]
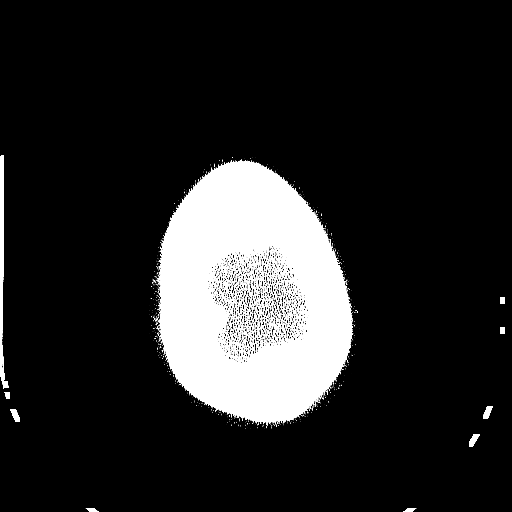
[im 68/78  bone]
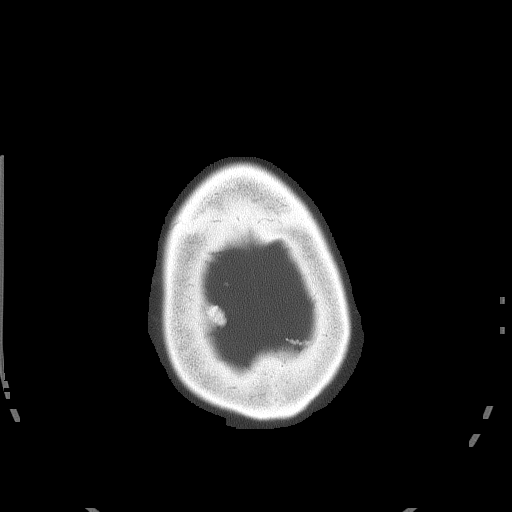
[im 73/78  brain]
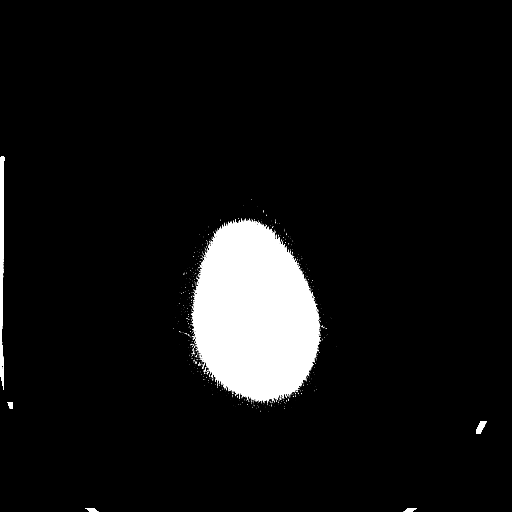

[14 of 30 positions shown; findings below may reference images not displayed]

FINDINGS: Interval increase in size of large left hemispheric intracranial
hemorrhage centered in the left parietal region with marked amount
of surrounding vasogenic edema involving left temporal lobe,
posterior left frontal lobe, left parietal lobe and left occipital
lobe.

Breakthrough of hemorrhage into the ventricle with moderate amount
of blood within the right lateral ventricle.

Midline shift to the right by 1.8 cm relatively similar to prior
exam.

Trapping of the right lateral ventricle with dilated the right
lateral ventricle.

Abnormal appearance of the basal ganglia/brainstem may indicate
infarct and/or extension or edema from intracranial hemorrhage.

Marked loss all of sulci and cisterns consistent with uncal and
downward herniation (low lying cerebellar tonsils with crowding of
the foramen magnum).

Left subdural hematoma measures up to 6.8 mm maximal thickness
without significant change.

No skull fracture detected.

Mucosal thickening paranasal sinuses.
IMPRESSION: Interval increase in size of large left hemispheric intracranial
hemorrhage centered in the left parietal region with marked amount
of surrounding vasogenic edema involving left temporal lobe,
posterior left frontal lobe, left parietal lobe and left occipital
lobe.

Breakthrough of hemorrhage into the ventricle with moderate amount
of blood within the right lateral ventricle.

Midline shift to the right by 1.8 cm relatively similar to prior
exam.

Trapping of the right lateral ventricle with dilated the right
lateral ventricle.

Abnormal appearance of the basal ganglia/brainstem may indicate
infarct and/or extension or edema from intracranial hemorrhage.

Marked loss all of sulci and cisterns consistent with uncal and
downward herniation (low lying cerebellar tonsils with crowding of
the foramen magnum).

Left subdural hematoma measures up to 6.8 mm maximal thickness
without significant change.

These results were called by telephone at the time of interpretation
on 09/19/2013 at [DATE] to Abd [facility] patients nurse, who verbally
acknowledged these results. Request to call physician on-call with
results.

## 2014-06-03 IMAGING — CT CT HEAD W/O CM
1 series · 15 of 30 positions shown, 19 images · non-contrast
Comparison: CT HEAD W/O CM dated 09/19/2013; CT HEAD W/O CM dated
09/18/2013; CT C SPINE W/O CM dated 09/18/2013

CLINICAL DATA: Follow-up hemorrhagic stroke

EXAM:
CT HEAD WITHOUT CONTRAST
TECHNIQUE: Contiguous axial images were obtained from the base of the skull
through the vertex without intravenous contrast.

[Series 2: head 5.0 h30s · axial · 0.45mm/px · z∈[-151,-11]mm · 15 of 32 slices shown, 19 images]
[im 2/32  brain]
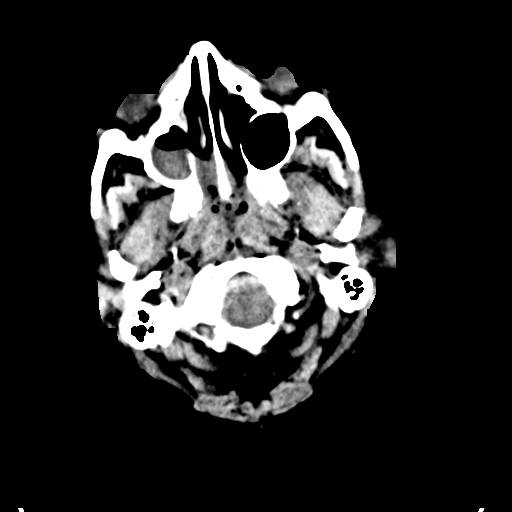
[im 2/32  bone]
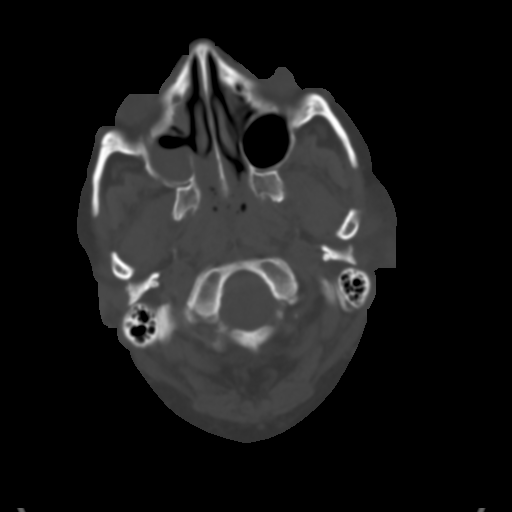
[im 4/32  brain]
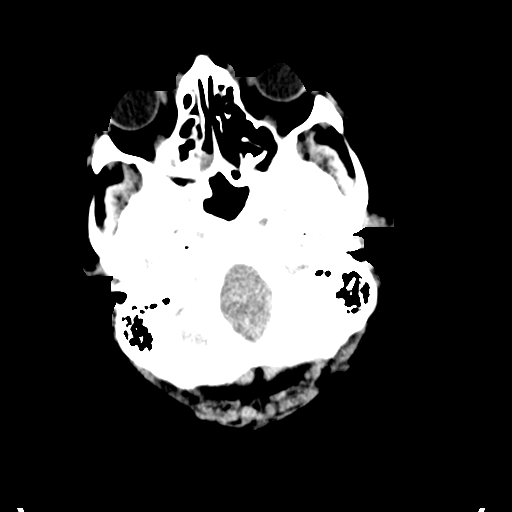
[im 6/32  brain]
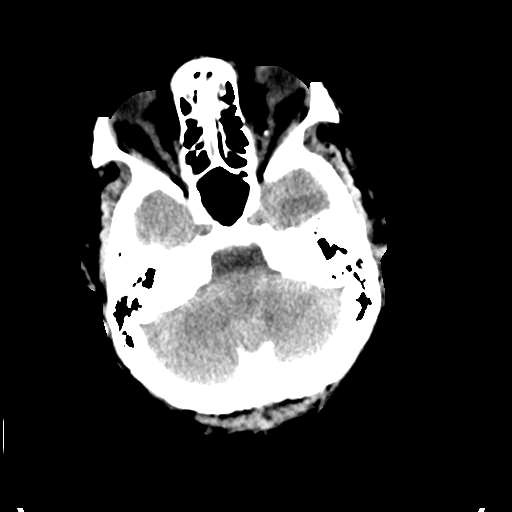
[im 8/32  brain]
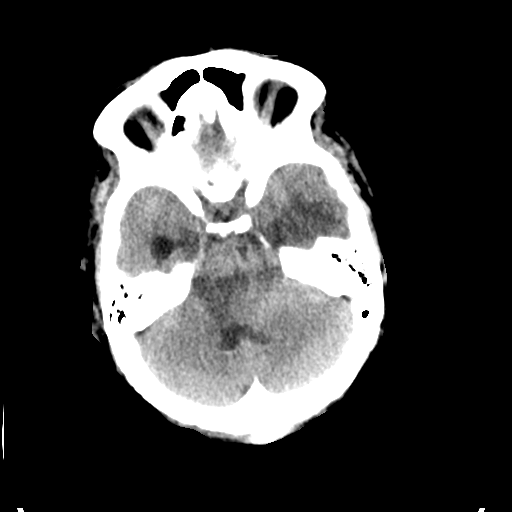
[im 10/32  brain]
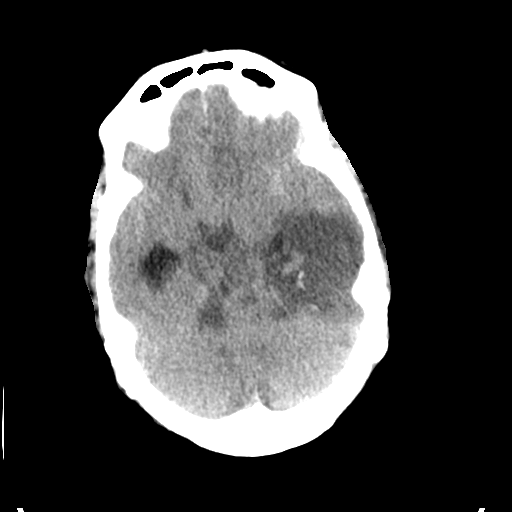
[im 10/32  bone]
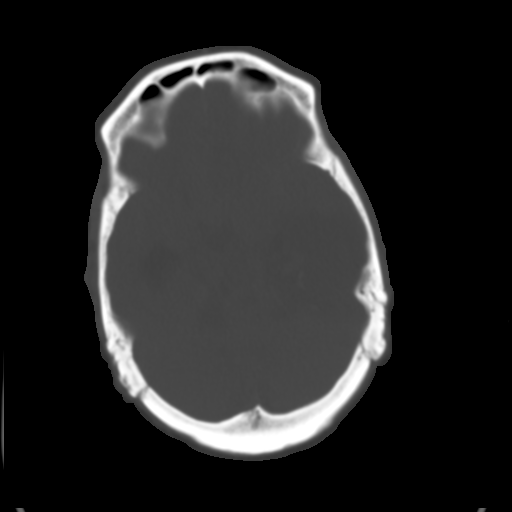
[im 12/32  brain]
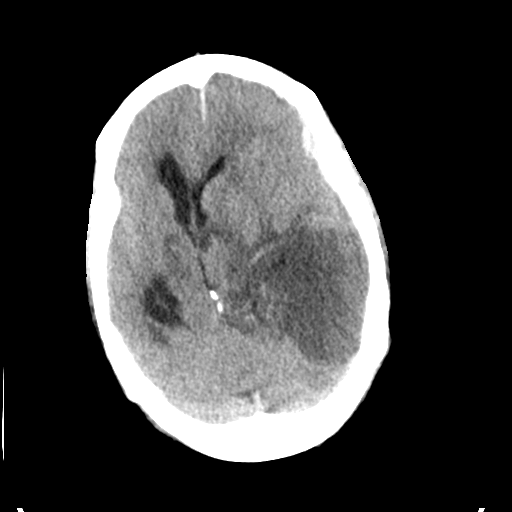
[im 14/32  brain]
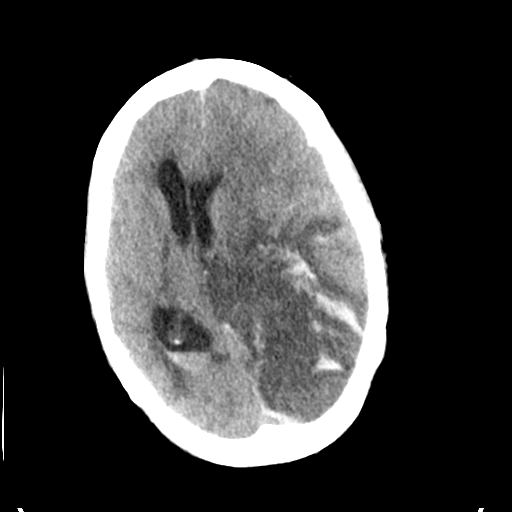
[im 17/32  brain]
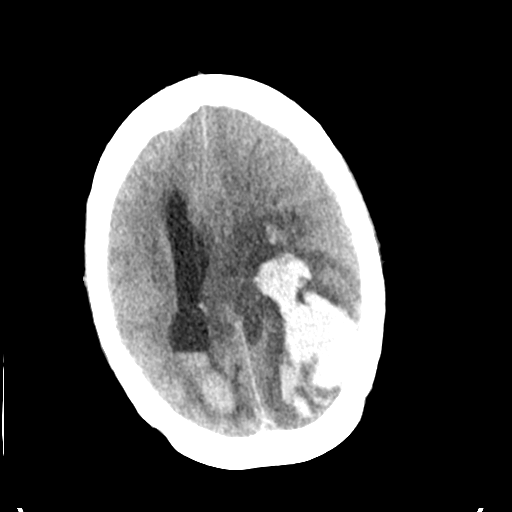
[im 18/32  brain]
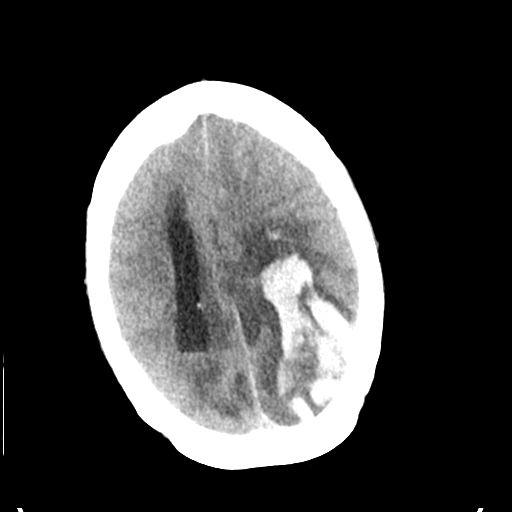
[im 18/32  bone]
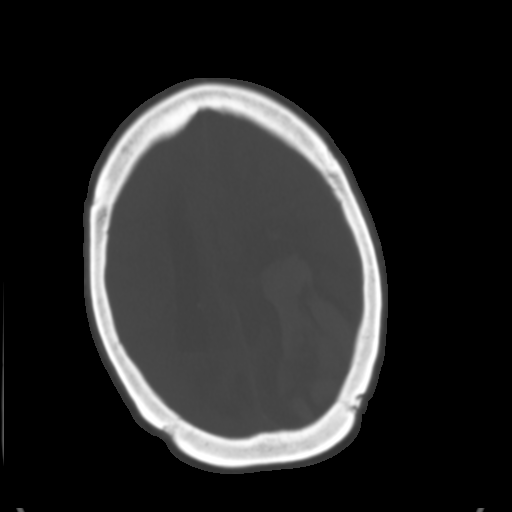
[im 20/32  brain]
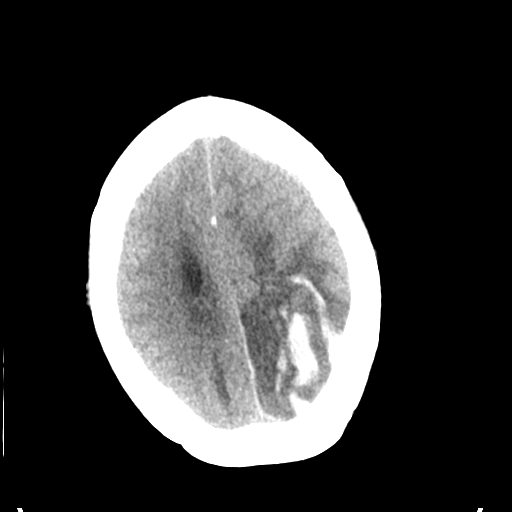
[im 22/32  brain]
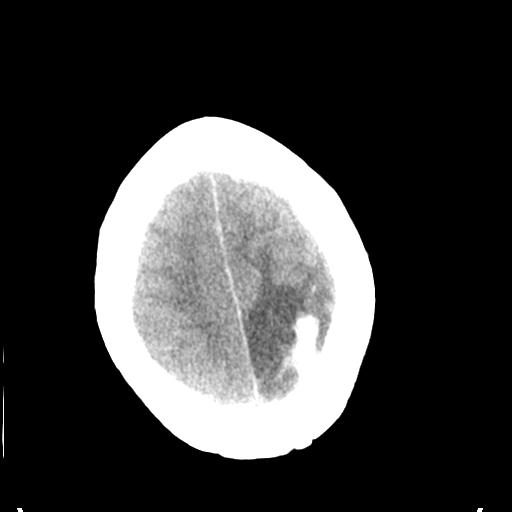
[im 24/32  brain]
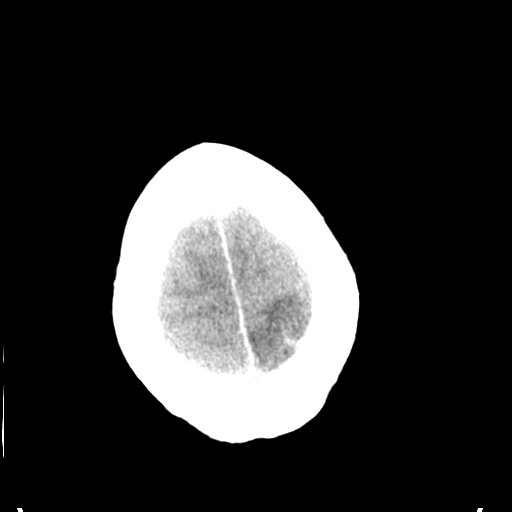
[im 26/32  brain]
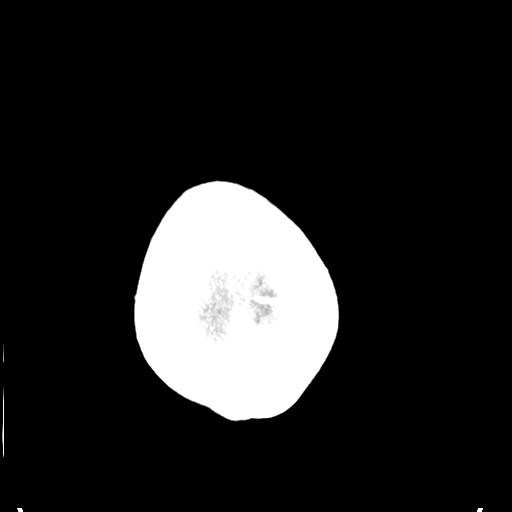
[im 26/32  bone]
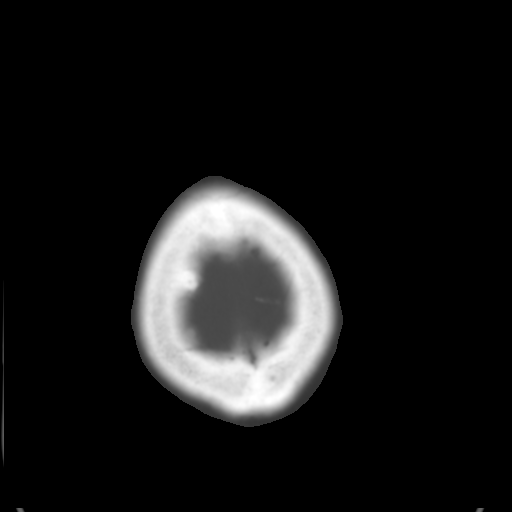
[im 28/32  brain]
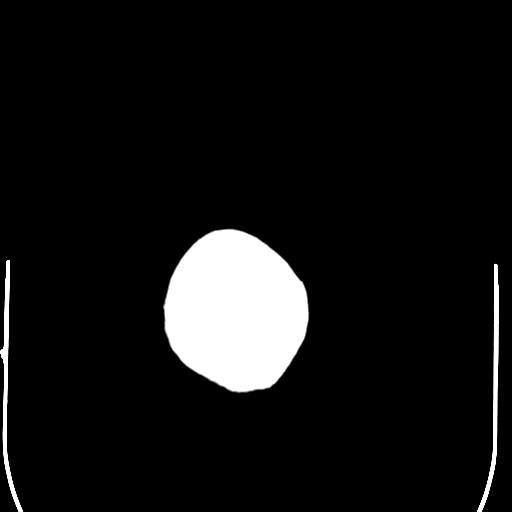
[im 30/32  brain]
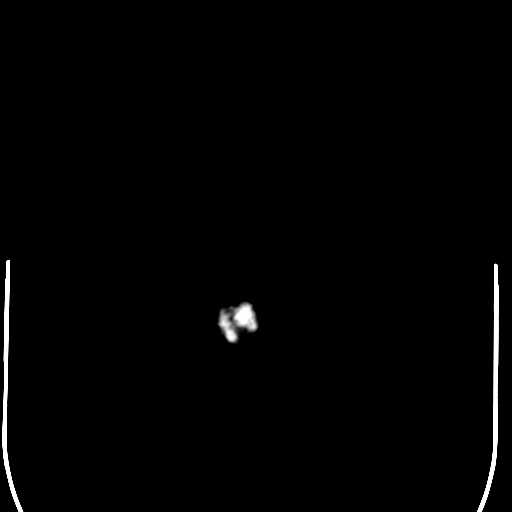

[15 of 30 positions shown; findings below may reference images not displayed]

FINDINGS: Stable large left parietal with marked amount of surrounding
vasogenic edema involving left temporal lobe, posterior left frontal
lobe, left parietal lobe and left occipital lobe. Stable moderate
amount of hemorrhage within the right lateral ventricle.

There is stable left-to-right midline shift measuring 18 mm. There
is complete effacement of the occipital horn of the left lateral
ventricle. Again noted is abnormal appearance of the brainstem which
may indicate infarct or extension of vasogenic edema from the
intracranial hemorrhage.

There is loss of the normal sulci and basal cisterns consistent with
uncal and
downward herniation manifested by crowding of the foramen magnum and
low-lying cerebellar tonsils. The overall appearance is unchanged
from the prior exam.

There is a stable 5 mm left subdural hematoma.

The calvarium is intact.

There is a right maxillary sinus and bilateral ethmoid sinus mucosal
thickening.
IMPRESSION: 1. Stable large left parietal lobe intracranial hemorrhage with
severe surrounding vasogenic edema. There is stable intraventricular
hemorrhage. There is stable left-to-right midline shift. There is
stable abnormal appearance of the brainstem which may indicate
extension of infarction versus vasogenic edema.
2. There is stable loss of sulcation and the basal cisterns
consistent with increased intracranial pressure with crowding of the
foramen magnum and low lying cerebellar tonsils.
3. Stable 5 mm left subdural hematoma.

## 2014-06-04 IMAGING — CR DG CHEST 1V PORT
1 series · 1 of 1 positions shown · non-contrast
Comparison: 09/19/2013

CLINICAL DATA: Respiratory failure.

EXAM:
PORTABLE CHEST - 1 VIEW

[AP]
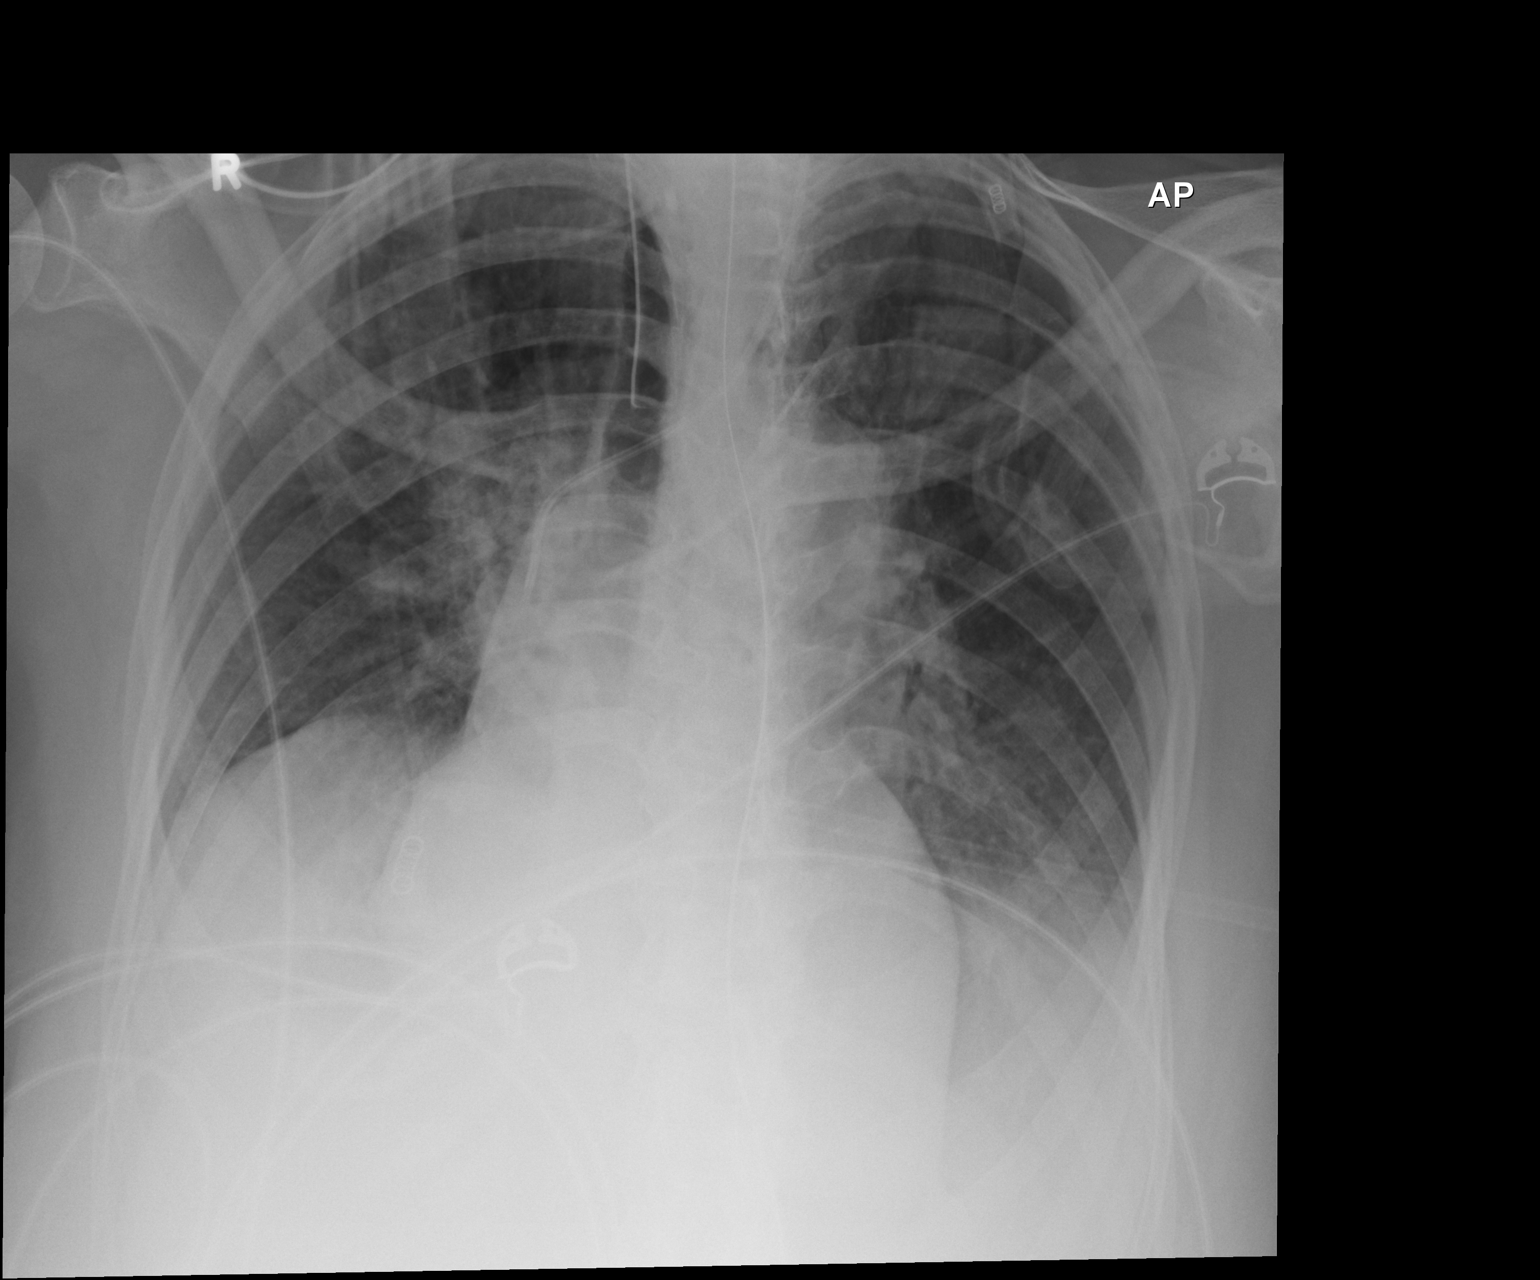

[1 of 1 positions shown; findings below may reference images not displayed]

FINDINGS: Support devices remain in stable position. Bilateral lower lobe
opacities with small effusions. Heart is borderline in size. No
pneumothorax.
IMPRESSION: Increasing bibasilar opacities, likely atelectasis. Suspect small
bilateral effusions.
